# Patient Record
Sex: Female | Born: 1960 | Race: White | Hispanic: No | Smoking: Never smoker
Health system: Southern US, Community
[De-identification: ages and names within clinical notes are randomized; demographics above are authoritative.]

## PROBLEM LIST (undated history)

## (undated) DIAGNOSIS — F32A Depression, unspecified: Secondary | ICD-10-CM

## (undated) DIAGNOSIS — F329 Major depressive disorder, single episode, unspecified: Secondary | ICD-10-CM

## (undated) DIAGNOSIS — F419 Anxiety disorder, unspecified: Secondary | ICD-10-CM

## (undated) HISTORY — DX: Depression, unspecified: F32.A

## (undated) HISTORY — DX: Major depressive disorder, single episode, unspecified: F32.9

## (undated) HISTORY — DX: Anxiety disorder, unspecified: F41.9

---

## 1995-10-15 HISTORY — PX: TUBAL LIGATION: SHX77

## 1998-09-26 ENCOUNTER — Other Ambulatory Visit: Admission: RE | Admit: 1998-09-26 | Discharge: 1998-09-26 | Payer: Self-pay | Admitting: Gynecology

## 1999-07-25 ENCOUNTER — Other Ambulatory Visit: Admission: RE | Admit: 1999-07-25 | Discharge: 1999-07-25 | Payer: Self-pay | Admitting: Gynecology

## 2000-07-28 ENCOUNTER — Other Ambulatory Visit: Admission: RE | Admit: 2000-07-28 | Discharge: 2000-07-28 | Payer: Self-pay | Admitting: Gynecology

## 2002-08-09 ENCOUNTER — Other Ambulatory Visit: Admission: RE | Admit: 2002-08-09 | Discharge: 2002-08-09 | Payer: Self-pay | Admitting: Internal Medicine

## 2003-06-08 ENCOUNTER — Other Ambulatory Visit: Admission: RE | Admit: 2003-06-08 | Discharge: 2003-06-08 | Payer: Self-pay | Admitting: Gynecology

## 2004-06-28 ENCOUNTER — Other Ambulatory Visit: Admission: RE | Admit: 2004-06-28 | Discharge: 2004-06-28 | Payer: Self-pay | Admitting: Gynecology

## 2005-12-30 ENCOUNTER — Other Ambulatory Visit: Admission: RE | Admit: 2005-12-30 | Discharge: 2005-12-30 | Payer: Self-pay | Admitting: Gynecology

## 2008-05-16 ENCOUNTER — Other Ambulatory Visit: Admission: RE | Admit: 2008-05-16 | Discharge: 2008-05-16 | Payer: Self-pay | Admitting: Gynecology

## 2009-05-18 ENCOUNTER — Encounter: Payer: Self-pay | Admitting: Women's Health

## 2009-05-18 ENCOUNTER — Ambulatory Visit: Payer: Self-pay | Admitting: Women's Health

## 2009-05-18 ENCOUNTER — Other Ambulatory Visit: Admission: RE | Admit: 2009-05-18 | Discharge: 2009-05-18 | Payer: Self-pay | Admitting: Gynecology

## 2009-05-22 ENCOUNTER — Ambulatory Visit: Payer: Self-pay | Admitting: Women's Health

## 2010-01-01 ENCOUNTER — Ambulatory Visit: Payer: Self-pay | Admitting: Women's Health

## 2011-06-14 DIAGNOSIS — F32A Depression, unspecified: Secondary | ICD-10-CM | POA: Insufficient documentation

## 2011-06-14 DIAGNOSIS — F419 Anxiety disorder, unspecified: Secondary | ICD-10-CM | POA: Insufficient documentation

## 2011-06-14 DIAGNOSIS — F329 Major depressive disorder, single episode, unspecified: Secondary | ICD-10-CM | POA: Insufficient documentation

## 2011-06-20 ENCOUNTER — Encounter: Payer: Self-pay | Admitting: Women's Health

## 2011-06-20 ENCOUNTER — Other Ambulatory Visit: Payer: Self-pay

## 2011-06-20 ENCOUNTER — Other Ambulatory Visit (HOSPITAL_COMMUNITY)
Admission: RE | Admit: 2011-06-20 | Discharge: 2011-06-20 | Disposition: A | Discharge status: Home / Self Care | Payer: Managed Care, Other (non HMO) | Source: Ambulatory Visit | Attending: Women's Health | Admitting: Women's Health

## 2011-06-20 ENCOUNTER — Ambulatory Visit (INDEPENDENT_AMBULATORY_CARE_PROVIDER_SITE_OTHER): Payer: Managed Care, Other (non HMO) | Admitting: Women's Health

## 2011-06-20 VITALS — BP 120/70 | Ht 64.75 in | Wt 121.0 lb

## 2011-06-20 DIAGNOSIS — Z01419 Encounter for gynecological examination (general) (routine) without abnormal findings: Secondary | ICD-10-CM | POA: Insufficient documentation

## 2011-06-20 DIAGNOSIS — R928 Other abnormal and inconclusive findings on diagnostic imaging of breast: Secondary | ICD-10-CM

## 2011-06-20 DIAGNOSIS — N63 Unspecified lump in unspecified breast: Secondary | ICD-10-CM

## 2011-06-20 NOTE — Progress Notes (Signed)
Loretta Parker [birth year] [id number]    History:    The patient presents for annual exam.  Works as a Systems analyst. Daughter Trula Ore is doing better,has a history of anorexia that required inpatient hospitalization and she is a type I diabetic.   Past medical history, past surgical history, family history and social history were all reviewed and documented in the EPIC chart.   ROS:  A  ROS was performed and pertinent positives and negatives are included in the history.  Exam:  Filed Vitals:   06/20/11 1405  BP: 120/70    General appearance:  Normal Head/Neck:  Normal, without cervical or supraclavicular adenopathy. Thyroid:  Symmetrical, normal in size, without palpable masses or nodularity. Respiratory  Effort:  Normal  Auscultation:  Clear without wheezing or rhonchi Cardiovascular  Auscultation:  Regular rate, without rubs, murmurs or gallops  Edema/varicosities:  Not grossly evident Abdominal  Soft,nontender, without masses, guarding or rebound.  Liver/spleen:  No organomegaly noted  Hernia:  None appreciated  Skin  Inspection:  Grossly normal  Palpation:  Grossly normal Neurologic/psychiatric  Orientation:  Normal with appropriate conversation.  Mood/affect:  Normal  Genitourinary    Breasts: Examined lying and sitting.     Right: 2 cm mobile nodule on outer aspect at 9:00     Left: Without masses, retractions, discharge or axillary adenopathy.   Inguinal/mons:  Normal without inguinal adenopathy  External genitalia:  Normal  BUS/Urethra/Skene's glands:  Normal  Bladder:  Normal  Vagina:  Normal  Cervix:  Normal  Uterus: normal in size, shape and contour.  Midline and mobile  Adnexa/parametria:     Rt: Without masses or tenderness.   Lt: Without masses or tenderness.  Anus and perineum: Normal  Digital rectal exam: Normal sphincter tone without palpated masses or tenderness  Assessment/Plan:  50 y.o.DWF G2P2  for annual exam. Monthly 4-5 day cycle,  history of a BTL. Denies menopausal symptoms. States had normal labs at her primary care.  2 CM mobile nodule on right outer rest at 9:00 position. Diagnostic mammogram with ultrasound scheduled for 06/25/2011. Her last mammogram was in 06, and did again reviewed the importance of annual screening.Reviewed importance of screening colonoscopy, states will schedule. Calcium rich diet encouraged, continue SBEs.. Pap only today.    [name] [facility], 5:35 PM 06/20/2011

## 2011-06-20 NOTE — Progress Notes (Signed)
Loretta Parker AT [facility] SET UP APPT FOR 06/25/11 @ 2P.M. PT. NOTIFIED OF APPT. FAXED RELEASE TO [facility] TO GET PTS. PREVIOUS FILMS FROM THEM

## 2011-06-25 ENCOUNTER — Ambulatory Visit
Admission: RE | Admit: 2011-06-25 | Discharge: 2011-06-25 | Disposition: A | Discharge status: Home / Self Care | Payer: Managed Care, Other (non HMO) | Source: Ambulatory Visit | Attending: Women's Health | Admitting: Women's Health

## 2011-06-25 DIAGNOSIS — N63 Unspecified lump in unspecified breast: Secondary | ICD-10-CM

## 2012-02-04 ENCOUNTER — Encounter: Payer: Self-pay | Admitting: Gynecology

## 2012-02-04 ENCOUNTER — Ambulatory Visit (INDEPENDENT_AMBULATORY_CARE_PROVIDER_SITE_OTHER): Payer: Managed Care, Other (non HMO) | Admitting: Gynecology

## 2012-02-04 VITALS — BP 110/70

## 2012-02-04 DIAGNOSIS — N760 Acute vaginitis: Secondary | ICD-10-CM

## 2012-02-04 DIAGNOSIS — A499 Bacterial infection, unspecified: Secondary | ICD-10-CM

## 2012-02-04 DIAGNOSIS — B9689 Other specified bacterial agents as the cause of diseases classified elsewhere: Secondary | ICD-10-CM

## 2012-02-04 DIAGNOSIS — N898 Other specified noninflammatory disorders of vagina: Secondary | ICD-10-CM

## 2012-02-04 LAB — WET PREP FOR TRICH, YEAST, CLUE: Yeast Wet Prep HPF POC: NONE SEEN

## 2012-02-04 MED ORDER — CLINDAMYCIN PHOSPHATE 2 % VA CREA: 1.0000 | TOPICAL_CREAM | Freq: Every day | VAGINAL | Status: AC

## 2012-02-04 NOTE — Patient Instructions (Signed)
Bacterial Vaginosis Bacterial vaginosis (BV) is a vaginal infection where the normal balance of bacteria in the vagina is disrupted. The normal balance is then replaced by an overgrowth of certain bacteria. There are several different kinds of bacteria that can cause BV. BV is the most common vaginal infection in women of childbearing age. CAUSES   The cause of BV is not fully understood. BV develops when there is an increase or imbalance of harmful bacteria.   Some activities or behaviors can upset the normal balance of bacteria in the vagina and put women at increased risk including:   Having a new sex partner or multiple sex partners.   Douching.   Using an intrauterine device (IUD) for contraception.   It is not clear what role sexual activity plays in the development of BV. However, women that have never had sexual intercourse are rarely infected with BV.  Women do not get BV from toilet seats, bedding, swimming pools or from touching objects around them.  SYMPTOMS   Grey vaginal discharge.   A fish-like odor with discharge, especially after sexual intercourse.   Itching or burning of the vagina and vulva.   Burning or pain with urination.   Some women have no signs or symptoms at all.  DIAGNOSIS  Your caregiver must examine the vagina for signs of BV. Your caregiver will perform lab tests and look at the sample of vaginal fluid through a microscope. They will look for bacteria and abnormal cells (clue cells), a pH test higher than 4.5, and a positive amine test all associated with BV.  RISKS AND COMPLICATIONS   Pelvic inflammatory disease (PID).   Infections following gynecology surgery.   Developing HIV.   Developing herpes virus.  TREATMENT  Sometimes BV will clear up without treatment. However, all women with symptoms of BV should be treated to avoid complications, especially if gynecology surgery is planned. Female partners generally do not need to be treated. However,  BV may spread between female sex partners so treatment is helpful in preventing a recurrence of BV.   BV may be treated with antibiotics. The antibiotics come in either pill or vaginal cream forms. Either can be used with nonpregnant or pregnant women, but the recommended dosages differ. These antibiotics are not harmful to the baby.   BV can recur after treatment. If this happens, a second round of antibiotics will often be prescribed.   Treatment is important for pregnant women. If not treated, BV can cause a premature delivery, especially for a pregnant woman who had a premature birth in the past. All pregnant women who have symptoms of BV should be checked and treated.   For chronic reoccurrence of BV, treatment with a type of prescribed gel vaginally twice a week is helpful.  HOME CARE INSTRUCTIONS   Finish all medication as directed by your caregiver.   Do not have sex until treatment is completed.   Tell your sexual partner that you have a vaginal infection. They should see their caregiver and be treated if they have problems, such as a mild rash or itching.   Practice safe sex. Use condoms. Only have 1 sex partner.  PREVENTION  Basic prevention steps can help reduce the risk of upsetting the natural balance of bacteria in the vagina and developing BV:  Do not have sexual intercourse (be abstinent).   Do not douche.   Use all of the medicine prescribed for treatment of BV, even if the signs and symptoms go away.     Tell your sex partner if you have BV. That way, they can be treated, if needed, to prevent reoccurrence.  SEEK MEDICAL CARE IF:   Your symptoms are not improving after 3 days of treatment.   You have increased discharge, pain, or fever.  MAKE SURE YOU:   Understand these instructions.   Will watch your condition.   Will get help right away if you are not doing well or get worse.  FOR MORE INFORMATION  Division of STD Prevention (DSTDP), Centers for Disease  Control and Prevention: www.cdc.gov/std American Social Health Association (ASHA): www.ashastd.org  Document Released: 09/30/2005 Document Revised: 09/19/2011 Document Reviewed: 03/23/2009 [employer] Patient Information 2012 [employer]. 

## 2012-02-04 NOTE — Progress Notes (Signed)
Patient 51 year old who presented to the office today stating her last menstrual period was April 9 which lasted 5 days which is normal for her. After missing she continued having brownish discharge with a fishy odor. Patient with prior history of tubal ligation several years ago. Patient is in a monogamous relationship.  Exam: Bartholin urethra Skene was: Within normal limits Vagina: Foreign body noted (tampon) no other lesions noted Cervix: No lesions or discharge Uterus: Anteverted normal size shape and consistency Adnexa: No palpable masses or tenderness Rectal: Not examined  After the tampon was removed and discarded a wet prep was done which demonstrated evidence of bacterial vaginosis (many clue cells and too numerous to count bacteria and positive amine).  Assessment/plan: Bacterial vaginosis as a result of retained tampon which was removed. Patient will be placed on Cleocin vaginal cream to apply each bedtime for 5 days.

## 2012-06-25 ENCOUNTER — Encounter: Payer: Managed Care, Other (non HMO) | Admitting: Women's Health

## 2012-07-02 ENCOUNTER — Encounter: Payer: Self-pay | Admitting: Women's Health

## 2012-07-02 ENCOUNTER — Ambulatory Visit (INDEPENDENT_AMBULATORY_CARE_PROVIDER_SITE_OTHER): Payer: Managed Care, Other (non HMO) | Admitting: Women's Health

## 2012-07-02 VITALS — BP 112/82 | Ht 65.0 in | Wt 118.0 lb

## 2012-07-02 DIAGNOSIS — G4700 Insomnia, unspecified: Secondary | ICD-10-CM

## 2012-07-02 DIAGNOSIS — B3731 Acute candidiasis of vulva and vagina: Secondary | ICD-10-CM

## 2012-07-02 DIAGNOSIS — B373 Candidiasis of vulva and vagina: Secondary | ICD-10-CM

## 2012-07-02 DIAGNOSIS — Z01419 Encounter for gynecological examination (general) (routine) without abnormal findings: Secondary | ICD-10-CM

## 2012-07-02 LAB — CBC WITH DIFFERENTIAL/PLATELET
HCT: 38.5 % (ref 36.0–46.0)
Hemoglobin: 13.7 g/dL (ref 12.0–15.0)
Lymphs Abs: 2 10*3/uL (ref 0.7–4.0)
MCH: 30.9 pg (ref 26.0–34.0)
Monocytes Relative: 7 % (ref 3–12)
Neutro Abs: 4.3 10*3/uL (ref 1.7–7.7)
Neutrophils Relative %: 61 % (ref 43–77)
RBC: 4.44 MIL/uL (ref 3.87–5.11)

## 2012-07-02 LAB — WET PREP FOR TRICH, YEAST, CLUE: Clue Cells Wet Prep HPF POC: NONE SEEN

## 2012-07-02 MED ORDER — ZOLPIDEM TARTRATE 10 MG PO TABS: 10.0000 mg | ORAL_TABLET | Freq: Every evening | ORAL | Status: DC | PRN

## 2012-07-02 MED ORDER — FLUCONAZOLE 150 MG PO TABS: 150.0000 mg | ORAL_TABLET | Freq: Once | ORAL | Status: DC

## 2012-07-02 NOTE — Progress Notes (Signed)
Loretta Parker 06-Apr-1961 [id number]    History:    The patient presents for annual exam.  Regular monthly cycle history of BTL. History of normal Paps and mammograms. Has not had a colonoscopy.   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 Christina 20, anorexia and type 1 diabetes. Rolm Gala doing well graduated from [employer] moved to [place]. Engaged greater than 10 years, partner diagnosed with tonsil esophageal cancer associated with HPV had radiation 2011-2012.   ROS:  A  ROS was performed and pertinent positives and negatives are included in the history.  Exam:  Filed Vitals:   07/02/12 1406  BP: 112/82    General appearance:  Normal Head/Neck:  Normal, without cervical or supraclavicular adenopathy. Thyroid:  Symmetrical, normal in size, without palpable masses or nodularity. Respiratory  Effort:  Normal  Auscultation:  Clear without wheezing or rhonchi Cardiovascular  Auscultation:  Regular rate, without rubs, murmurs or gallops  Edema/varicosities:  Not grossly evident Abdominal  Soft,nontender, without masses, guarding or rebound.  Liver/spleen:  No organomegaly noted  Hernia:  None appreciated  Skin  Inspection:  Grossly normal  Palpation:  Grossly normal Neurologic/psychiatric  Orientation:  Normal with appropriate conversation.  Mood/affect:  Normal  Genitourinary    Breasts: Examined lying and sitting.     Right: Without masses, retractions, discharge or axillary adenopathy.     Left: Without masses, retractions, discharge or axillary adenopathy.   Inguinal/mons:  Normal without inguinal adenopathy  External genitalia:  Normal  BUS/Urethra/Skene's glands:  Normal  Bladder:  Normal  Vagina:  Yeast   Cervix:  Normal  Uterus:   normal in size, shape and contour.  Midline and mobile  Adnexa/parametria:     Rt: Without masses or tenderness.   Lt: Without masses or  tenderness.  Anus and perineum: Normal  Digital rectal exam: Normal sphincter tone without palpated masses or tenderness  Assessment/Plan:  51 y.o. D. WF G2 P2 for annual exam with complaint of vaginal itching for 2 days.  Yeast  Plan: Diflucan 150mg  with refill. Aware of yeast prevention. CBC, UA, Pap with HR HPV typing. New Pap screening guidelines reviewed. SBE's, annual mammogram, calcium rich diet, vitamin D 1000 daily. Instructed to schedule screening colonoscopy. Home Hemoccult card given with instructions.    [name] [facility], 5:31 PM 07/02/2012

## 2012-07-02 NOTE — Patient Instructions (Signed)
Health Maintenance, Females A healthy lifestyle and preventative care can promote health and wellness.  Maintain regular health, dental, and eye exams.   Eat a healthy diet. Foods like vegetables, fruits, whole grains, low-fat dairy products, and lean protein foods contain the nutrients you need without too many calories. Decrease your intake of foods high in solid fats, added sugars, and salt. Get information about a proper diet from your caregiver, if necessary.   Regular physical exercise is one of the most important things you can do for your health. Most adults should get at least 150 minutes of moderate-intensity exercise (any activity that increases your heart rate and causes you to sweat) each week. In addition, most adults need muscle-strengthening exercises on 2 or more days a week.    Maintain a healthy weight. The body mass index (BMI) is a screening tool to identify possible weight problems. It provides an estimate of body fat based on height and weight. Your caregiver can help determine your BMI, and can help you achieve or maintain a healthy weight. For adults 20 years and older:   A BMI below 18.5 is considered underweight.   A BMI of 18.5 to 24.9 is normal.   A BMI of 25 to 29.9 is considered overweight.   A BMI of 30 and above is considered obese.   Maintain normal blood lipids and cholesterol by exercising and minimizing your intake of saturated fat. Eat a balanced diet with plenty of fruits and vegetables. Blood tests for lipids and cholesterol should begin at age 20 and be repeated every 5 years. If your lipid or cholesterol levels are high, you are over 50, or you are a high risk for heart disease, you may need your cholesterol levels checked more frequently.Ongoing high lipid and cholesterol levels should be treated with medicines if diet and exercise are not effective.   If you smoke, find out from your caregiver how to quit. If you do not use tobacco, do not start.    If you are pregnant, do not drink alcohol. If you are breastfeeding, be very cautious about drinking alcohol. If you are not pregnant and choose to drink alcohol, do not exceed 1 drink per day. One drink is considered to be 12 ounces (355 mL) of beer, 5 ounces (148 mL) of wine, or 1.5 ounces (44 mL) of liquor.   Avoid use of street drugs. Do not share needles with anyone. Ask for help if you need support or instructions about stopping the use of drugs.   High blood pressure causes heart disease and increases the risk of stroke. Blood pressure should be checked at least every 1 to 2 years. Ongoing high blood pressure should be treated with medicines, if weight loss and exercise are not effective.   If you are 55 to 51 years old, ask your caregiver if you should take aspirin to prevent strokes.   Diabetes screening involves taking a blood sample to check your fasting blood sugar level. This should be done once every 3 years, after age 45, if you are within normal weight and without risk factors for diabetes. Testing should be considered at a younger age or be carried out more frequently if you are overweight and have at least 1 risk factor for diabetes.   Breast cancer screening is essential preventative care for women. You should practice "breast self-awareness." This means understanding the normal appearance and feel of your breasts and may include breast self-examination. Any changes detected, no matter how   small, should be reported to a caregiver. Women in their 20s and 30s should have a clinical breast exam (CBE) by a caregiver as part of a regular health exam every 1 to 3 years. After age 40, women should have a CBE every year. Starting at age 40, women should consider having a mammogram (breast X-ray) every year. Women who have a family history of breast cancer should talk to their caregiver about genetic screening. Women at a high risk of breast cancer should talk to their caregiver about having  an MRI and a mammogram every year.   The Pap test is a screening test for cervical cancer. Women should have a Pap test starting at age 21. Between ages 21 and 29, Pap tests should be repeated every 2 years. Beginning at age 30, you should have a Pap test every 3 years as long as the past 3 Pap tests have been normal. If you had a hysterectomy for a problem that was not cancer or a condition that could lead to cancer, then you no longer need Pap tests. If you are between ages 65 and 70, and you have had normal Pap tests going back 10 years, you no longer need Pap tests. If you have had past treatment for cervical cancer or a condition that could lead to cancer, you need Pap tests and screening for cancer for at least 20 years after your treatment. If Pap tests have been discontinued, risk factors (such as a new sexual partner) need to be reassessed to determine if screening should be resumed. Some women have medical problems that increase the chance of getting cervical cancer. In these cases, your caregiver may recommend more frequent screening and Pap tests.   The human papillomavirus (HPV) test is an additional test that may be used for cervical cancer screening. The HPV test looks for the virus that can cause the cell changes on the cervix. The cells collected during the Pap test can be tested for HPV. The HPV test could be used to screen women aged 30 years and older, and should be used in women of any age who have unclear Pap test results. After the age of 30, women should have HPV testing at the same frequency as a Pap test.   Colorectal cancer can be detected and often prevented. Most routine colorectal cancer screening begins at the age of 50 and continues through age 75. However, your caregiver may recommend screening at an earlier age if you have risk factors for colon cancer. On a yearly basis, your caregiver may provide home test kits to check for hidden blood in the stool. Use of a small camera at  the end of a tube, to directly examine the colon (sigmoidoscopy or colonoscopy), can detect the earliest forms of colorectal cancer. Talk to your caregiver about this at age 50, when routine screening begins. Direct examination of the colon should be repeated every 5 to 10 years through age 75, unless early forms of pre-cancerous polyps or small growths are found.   Hepatitis C blood testing is recommended for all people born from 1945 through 1965 and any individual with known risks for hepatitis C.   Practice safe sex. Use condoms and avoid high-risk sexual practices to reduce the spread of sexually transmitted infections (STIs). Sexually active women aged 25 and younger should be checked for Chlamydia, which is a common sexually transmitted infection. Older women with new or multiple partners should also be tested for Chlamydia. Testing for other   STIs is recommended if you are sexually active and at increased risk.   Osteoporosis is a disease in which the bones lose minerals and strength with aging. This can result in serious bone fractures. The risk of osteoporosis can be identified using a bone density scan. Women ages 49 and over and women at risk for fractures or osteoporosis should discuss screening with their caregivers. Ask your caregiver whether you should be taking a calcium supplement or vitamin D to reduce the rate of osteoporosis.   Menopause can be associated with physical symptoms and risks. Hormone replacement therapy is available to decrease symptoms and risks. You should talk to your caregiver about whether hormone replacement therapy is right for you.   Use sunscreen with a sun protection factor (SPF) of 30 or greater. Apply sunscreen liberally and repeatedly throughout the day. You should seek shade when your shadow is shorter than you. Protect yourself by wearing long sleeves, pants, a wide-brimmed hat, and sunglasses year round, whenever you are outdoors.   Notify your caregiver  of new moles or changes in moles, especially if there is a change in shape or color. Also notify your caregiver if a mole is larger than the size of a pencil eraser.   Stay current with your immunizations.  Document Released: 04/15/2011 Document Revised: 09/19/2011 Document Reviewed: 04/15/2011 [facility] Patient Information 2012 [place], [place].Insomnia Insomnia is frequent trouble falling and/or staying asleep. Insomnia can be a long term problem or a short term problem. Both are common. Insomnia can be a short term problem when the wakefulness is related to a certain stress or worry. Long term insomnia is often related to ongoing stress during waking hours and/or poor sleeping habits. Overtime, sleep deprivation itself can make the problem worse. Every little thing feels more severe because you are overtired and your ability to cope is decreased. CAUSES   Stress, anxiety, and depression.   Poor sleeping habits.   Distractions such as TV in the bedroom.   Naps close to bedtime.   Engaging in emotionally charged conversations before bed.   Technical reading before sleep.   Alcohol and other sedatives. They may make the problem worse. They can hurt normal sleep patterns and normal dream activity.   Stimulants such as caffeine for several hours prior to bedtime.   Pain syndromes and shortness of breath can cause insomnia.   Exercise late at night.   Changing time zones may cause sleeping problems (jet lag).  It is sometimes helpful to have someone observe your sleeping patterns. They should look for periods of not breathing during the night (sleep apnea). They should also look to see how long those periods last. If you live alone or observers are uncertain, you can also be observed at a sleep clinic where your sleep patterns will be professionally monitored. Sleep apnea requires a checkup and treatment. Give your caregivers your medical history. Give your caregivers observations your family  has made about your sleep.  SYMPTOMS   Not feeling rested in the morning.   Anxiety and restlessness at bedtime.   Difficulty falling and staying asleep.  TREATMENT   Your caregiver may prescribe treatment for an underlying medical disorders. Your caregiver can give advice or help if you are using alcohol or other drugs for self-medication. Treatment of underlying problems will usually eliminate insomnia problems.   Medications can be prescribed for short time use. They are generally not recommended for lengthy use.   Over-the-counter sleep medicines are not recommended for lengthy  use. They can be habit forming.   You can promote easier sleeping by making lifestyle changes such as:   Using relaxation techniques that help with breathing and reduce muscle tension.   Exercising earlier in the day.   Changing your diet and the time of your last meal. No night time snacks.   Establish a regular time to go to bed.   Counseling can help with stressful problems and worry.   Soothing music and white noise may be helpful if there are background noises you cannot remove.   Stop tedious detailed work at least one hour before bedtime.  HOME CARE INSTRUCTIONS   Keep a diary. Inform your caregiver about your progress. This includes any medication side effects. See your caregiver regularly. Take note of:   Times when you are asleep.   Times when you are awake during the night.   The quality of your sleep.   How you feel the next day.  This information will help your caregiver care for you.  Get out of bed if you are still awake after 15 minutes. Read or do some quiet activity. Keep the lights down. Wait until you feel sleepy and go back to bed.   Keep regular sleeping and waking hours. Avoid naps.   Exercise regularly.   Avoid distractions at bedtime. Distractions include watching television or engaging in any intense or detailed activity like attempting to balance the household  checkbook.   Develop a bedtime ritual. Keep a familiar routine of bathing, brushing your teeth, climbing into bed at the same time each night, listening to soothing music. Routines increase the success of falling to sleep faster.   Use relaxation techniques. This can be using breathing and muscle tension release routines. It can also include visualizing peaceful scenes. You can also help control troubling or intruding thoughts by keeping your mind occupied with boring or repetitive thoughts like the old concept of counting sheep. You can make it more creative like imagining planting one beautiful flower after another in your backyard garden.   During your day, work to eliminate stress. When this is not possible use some of the previous suggestions to help reduce the anxiety that accompanies stressful situations.  MAKE SURE YOU:   Understand these instructions.   Will watch your condition.   Will get help right away if you are not doing well or get worse.  Document Released: 09/27/2000 Document Revised: 09/19/2011 Document Reviewed: 10/28/2007 [facility] Patient Information 2012 [place], [place].

## 2012-07-03 LAB — URINALYSIS W MICROSCOPIC + REFLEX CULTURE
Casts: NONE SEEN
Crystals: NONE SEEN
Glucose, UA: NEGATIVE mg/dL
Squamous Epithelial / LPF: NONE SEEN
pH: 7 (ref 5.0–8.0)

## 2012-07-20 NOTE — Addendum Note (Signed)
Addended by: Bertram Savin A on: 07/20/2012 10:03 AM   Modules accepted: Orders

## 2012-12-04 ENCOUNTER — Ambulatory Visit: Payer: Managed Care, Other (non HMO) | Admitting: Women's Health

## 2013-07-08 ENCOUNTER — Ambulatory Visit (INDEPENDENT_AMBULATORY_CARE_PROVIDER_SITE_OTHER): Payer: Managed Care, Other (non HMO) | Admitting: Women's Health

## 2013-07-08 ENCOUNTER — Other Ambulatory Visit (HOSPITAL_COMMUNITY)
Admission: RE | Admit: 2013-07-08 | Discharge: 2013-07-08 | Disposition: A | Discharge status: Home / Self Care | Payer: Managed Care, Other (non HMO) | Source: Ambulatory Visit | Attending: Gynecology | Admitting: Gynecology

## 2013-07-08 ENCOUNTER — Encounter: Payer: Self-pay | Admitting: Women's Health

## 2013-07-08 VITALS — BP 115/70 | Ht 64.5 in | Wt 121.0 lb

## 2013-07-08 DIAGNOSIS — E079 Disorder of thyroid, unspecified: Secondary | ICD-10-CM

## 2013-07-08 DIAGNOSIS — Z01419 Encounter for gynecological examination (general) (routine) without abnormal findings: Secondary | ICD-10-CM

## 2013-07-08 DIAGNOSIS — Z23 Encounter for immunization: Secondary | ICD-10-CM

## 2013-07-08 DIAGNOSIS — G4700 Insomnia, unspecified: Secondary | ICD-10-CM | POA: Insufficient documentation

## 2013-07-08 MED ORDER — ESZOPICLONE 1 MG PO TABS: 1.0000 mg | ORAL_TABLET | Freq: Every day | ORAL | Status: DC

## 2013-07-08 NOTE — Patient Instructions (Addendum)
Colonoscopy Schedule mammogram  Health Maintenance, Females A healthy lifestyle and preventative care can promote health and wellness.  Maintain regular health, dental, and eye exams.  Eat a healthy diet. Foods like vegetables, fruits, whole grains, low-fat dairy products, and lean protein foods contain the nutrients you need without too many calories. Decrease your intake of foods high in solid fats, added sugars, and salt. Get information about a proper diet from your caregiver, if necessary.  Regular physical exercise is one of the most important things you can do for your health. Most adults should get at least 150 minutes of moderate-intensity exercise (any activity that increases your heart rate and causes you to sweat) each week. In addition, most adults need muscle-strengthening exercises on 2 or more days a week.   Maintain a healthy weight. The body mass index (BMI) is a screening tool to identify possible weight problems. It provides an estimate of body fat based on height and weight. Your caregiver can help determine your BMI, and can help you achieve or maintain a healthy weight. For adults 20 years and older:  A BMI below 18.5 is considered underweight.  A BMI of 18.5 to 24.9 is normal.  A BMI of 25 to 29.9 is considered overweight.  A BMI of 30 and above is considered obese.  Maintain normal blood lipids and cholesterol by exercising and minimizing your intake of saturated fat. Eat a balanced diet with plenty of fruits and vegetables. Blood tests for lipids and cholesterol should begin at age 47 and be repeated every 5 years. If your lipid or cholesterol levels are high, you are over 50, or you are a high risk for heart disease, you may need your cholesterol levels checked more frequently.Ongoing high lipid and cholesterol levels should be treated with medicines if diet and exercise are not effective.  If you smoke, find out from your caregiver how to quit. If you do not use  tobacco, do not start.  If you are pregnant, do not drink alcohol. If you are breastfeeding, be very cautious about drinking alcohol. If you are not pregnant and choose to drink alcohol, do not exceed 1 drink per day. One drink is considered to be 12 ounces (355 mL) of beer, 5 ounces (148 mL) of wine, or 1.5 ounces (44 mL) of liquor.  Avoid use of street drugs. Do not share needles with anyone. Ask for help if you need support or instructions about stopping the use of drugs.  High blood pressure causes heart disease and increases the risk of stroke. Blood pressure should be checked at least every 1 to 2 years. Ongoing high blood pressure should be treated with medicines, if weight loss and exercise are not effective.  If you are 67 to 52 years old, ask your caregiver if you should take aspirin to prevent strokes.  Diabetes screening involves taking a blood sample to check your fasting blood sugar level. This should be done once every 3 years, after age 35, if you are within normal weight and without risk factors for diabetes. Testing should be considered at a younger age or be carried out more frequently if you are overweight and have at least 1 risk factor for diabetes.  Breast cancer screening is essential preventative care for women. You should practice "breast self-awareness." This means understanding the normal appearance and feel of your breasts and may include breast self-examination. Any changes detected, no matter how small, should be reported to a caregiver. Women in their 64s  and 30s should have a clinical breast exam (CBE) by a caregiver as part of a regular health exam every 1 to 3 years. After age 50, women should have a CBE every year. Starting at age 18, women should consider having a mammogram (breast X-ray) every year. Women who have a family history of breast cancer should talk to their caregiver about genetic screening. Women at a high risk of breast cancer should talk to their  caregiver about having an MRI and a mammogram every year.  The Pap test is a screening test for cervical cancer. Women should have a Pap test starting at age 46. Between ages 19 and 27, Pap tests should be repeated every 2 years. Beginning at age 67, you should have a Pap test every 3 years as long as the past 3 Pap tests have been normal. If you had a hysterectomy for a problem that was not cancer or a condition that could lead to cancer, then you no longer need Pap tests. If you are between ages 82 and 34, and you have had normal Pap tests going back 10 years, you no longer need Pap tests. If you have had past treatment for cervical cancer or a condition that could lead to cancer, you need Pap tests and screening for cancer for at least 20 years after your treatment. If Pap tests have been discontinued, risk factors (such as a new sexual partner) need to be reassessed to determine if screening should be resumed. Some women have medical problems that increase the chance of getting cervical cancer. In these cases, your caregiver may recommend more frequent screening and Pap tests.  The human papillomavirus (HPV) test is an additional test that may be used for cervical cancer screening. The HPV test looks for the virus that can cause the cell changes on the cervix. The cells collected during the Pap test can be tested for HPV. The HPV test could be used to screen women aged 59 years and older, and should be used in women of any age who have unclear Pap test results. After the age of 21, women should have HPV testing at the same frequency as a Pap test.  Colorectal cancer can be detected and often prevented. Most routine colorectal cancer screening begins at the age of 108 and continues through age 38. However, your caregiver may recommend screening at an earlier age if you have risk factors for colon cancer. On a yearly basis, your caregiver may provide home test kits to check for hidden blood in the stool. Use  of a small camera at the end of a tube, to directly examine the colon (sigmoidoscopy or colonoscopy), can detect the earliest forms of colorectal cancer. Talk to your caregiver about this at age 43, when routine screening begins. Direct examination of the colon should be repeated every 5 to 10 years through age 65, unless early forms of pre-cancerous polyps or small growths are found.  Hepatitis C blood testing is recommended for all people born from [birth year] through 1965 and any individual with known risks for hepatitis C.  Practice safe sex. Use condoms and avoid high-risk sexual practices to reduce the spread of sexually transmitted infections (STIs). Sexually active women aged 35 and younger should be checked for Chlamydia, which is a common sexually transmitted infection. Older women with new or multiple partners should also be tested for Chlamydia. Testing for other STIs is recommended if you are sexually active and at increased risk.  Osteoporosis is a  disease in which the bones lose minerals and strength with aging. This can result in serious bone fractures. The risk of osteoporosis can be identified using a bone density scan. Women ages 62 and over and women at risk for fractures or osteoporosis should discuss screening with their caregivers. Ask your caregiver whether you should be taking a calcium supplement or vitamin D to reduce the rate of osteoporosis.  Menopause can be associated with physical symptoms and risks. Hormone replacement therapy is available to decrease symptoms and risks. You should talk to your caregiver about whether hormone replacement therapy is right for you.  Use sunscreen with a sun protection factor (SPF) of 30 or greater. Apply sunscreen liberally and repeatedly throughout the day. You should seek shade when your shadow is shorter than you. Protect yourself by wearing long sleeves, pants, a wide-brimmed hat, and sunglasses year round, whenever you are outdoors.  Notify  your caregiver of new moles or changes in moles, especially if there is a change in shape or color. Also notify your caregiver if a mole is larger than the size of a pencil eraser.  Stay current with your immunizations. Document Released: 04/15/2011 Document Revised: 12/23/2011 Document Reviewed: 04/15/2011 [employer] Patient Information 2014 [place], [place].

## 2013-07-08 NOTE — Addendum Note (Signed)
Addended by: Richardson Chiquito on: 07/08/2013 03:53 PM   Modules accepted: Orders

## 2013-07-08 NOTE — Addendum Note (Signed)
Addended by: Richardson Chiquito on: 07/08/2013 03:54 PM   Modules accepted: Orders

## 2013-07-08 NOTE — Progress Notes (Signed)
Loretta Parker 1960/12/20 [id number]    History:    The patient presents for annual exam.  Regular monthly cycles/BTL. Only complaint is unable to sleep well, states able to fall asleep but does not stay asleep causing fatigue. History of normal Paps and mammograms. Has not had a colonoscopy. Has had some problems with anxiety and depression on no medication denies need.    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 Partner of 10 years history of tonsil cancer and radiation treatment 2011 2012.   ROS:  A  ROS was performed and pertinent positives and negatives are included in the history.  Exam:  Filed Vitals:   07/08/13 1410  BP: 115/70    General appearance:  Normal Head/Neck:  Normal, without cervical or supraclavicular adenopathy. Thyroid:  Symmetrical, normal in size, without palpable masses or nodularity. Respiratory  Effort:  Normal  Auscultation:  Clear without wheezing or rhonchi Cardiovascular  Auscultation:  Regular rate, without rubs, murmurs or gallops  Edema/varicosities:  Not grossly evident Abdominal  Soft,nontender, without masses, guarding or rebound.  Liver/spleen:  No organomegaly noted  Hernia:  None appreciated  Skin  Inspection:  Grossly normal  Palpation:  Grossly normal Neurologic/psychiatric  Orientation:  Normal with appropriate conversation.  Mood/affect:  Normal  Genitourinary    Breasts: Examined lying and sitting.     Right: Without masses, retractions, discharge or axillary adenopathy.     Left: Without masses, retractions, discharge or axillary adenopathy.   Inguinal/mons:  Normal without inguinal adenopathy  External genitalia:  Normal  BUS/Urethra/Skene's glands:  Normal  Bladder:  Normal  Vagina:  Normal  Cervix:  Normal  Uterus:   normal in size, shape and contour.  Midline and mobile  Adnexa/parametria:     Rt: Without masses or tenderness.   Lt: Without masses or tenderness.  Anus and perineum: Normal  Digital rectal exam: Normal sphincter tone without palpated masses or tenderness  Assessment/Plan:  52 y.o. D. WF G2 P2 for annual exam.    Normal GYN exam/BTL Insomnia  Plan: SBE's, schedule annual mammogram, reviewed importance of annual screen, normal 2012. Continue regular exercise, healthy lifestyle, vitamin D 2000 daily encouraged. Options for insomnia reviewed will try Lunesta 1 mg prescription, proper use given and reviewed. Aware of good sleep hygiene. Reviewed and encouraged to schedule screening colonoscopy. CBC, TSH, UA, Pap. Pap normal 2012, new screening guidelines reviewed.   [name] [facility], 3:13 PM 07/08/2013

## 2013-07-09 LAB — URINALYSIS W MICROSCOPIC + REFLEX CULTURE
Bacteria, UA: NONE SEEN
Bilirubin Urine: NEGATIVE
Casts: NONE SEEN
Crystals: NONE SEEN
Ketones, ur: NEGATIVE mg/dL
Nitrite: NEGATIVE
Specific Gravity, Urine: 1.021 (ref 1.005–1.030)
pH: 7.5 (ref 5.0–8.0)

## 2014-07-21 ENCOUNTER — Encounter: Payer: Self-pay | Admitting: Women's Health

## 2014-07-21 ENCOUNTER — Ambulatory Visit (INDEPENDENT_AMBULATORY_CARE_PROVIDER_SITE_OTHER): Payer: 59 | Admitting: Women's Health

## 2014-07-21 VITALS — BP 124/80 | Ht 65.0 in | Wt 117.0 lb

## 2014-07-21 DIAGNOSIS — Z01419 Encounter for gynecological examination (general) (routine) without abnormal findings: Secondary | ICD-10-CM

## 2014-07-21 DIAGNOSIS — E079 Disorder of thyroid, unspecified: Secondary | ICD-10-CM

## 2014-07-21 LAB — CBC WITH DIFFERENTIAL/PLATELET
Basophils Absolute: 0.1 10*3/uL (ref 0.0–0.1)
Basophils Relative: 1 % (ref 0–1)
Eosinophils Absolute: 0.2 10*3/uL (ref 0.0–0.7)
Eosinophils Relative: 3 % (ref 0–5)
HCT: 40.9 % (ref 36.0–46.0)
HEMOGLOBIN: 13.7 g/dL (ref 12.0–15.0)
Lymphocytes Relative: 30 % (ref 12–46)
Lymphs Abs: 1.7 10*3/uL (ref 0.7–4.0)
MCH: 29 pg (ref 26.0–34.0)
MCHC: 33.5 g/dL (ref 30.0–36.0)
MCV: 86.7 fL (ref 78.0–100.0)
MONOS PCT: 6 % (ref 3–12)
Monocytes Absolute: 0.3 10*3/uL (ref 0.1–1.0)
NEUTROS ABS: 3.5 10*3/uL (ref 1.7–7.7)
NEUTROS PCT: 60 % (ref 43–77)
Platelets: 301 10*3/uL (ref 150–400)
RBC: 4.72 MIL/uL (ref 3.87–5.11)
RDW: 14.1 % (ref 11.5–15.5)
WBC: 5.8 10*3/uL (ref 4.0–10.5)

## 2014-07-21 NOTE — Progress Notes (Signed)
Loretta Parker May 06, 1961 [id number]    History:    Presents for annual exam.  Last cycle greater than 6 months ago. Cycles had become more irregular over past year, monthly prior. Occasional menopausal symptoms. Normal Pap and mammogram history, last mammogram 2012. Has not had a colonoscopy. Same partner.  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 Loretta Parker  at [employer] in a PhD program. Partner of many years tonsil cancer, struggling from side effects of radiation.  Ex-husband/father of her children died several months ago from alcohol related problem.  ROS:  A  12 point ROS was performed and pertinent positives and negatives are included.  Exam:  Filed Vitals:   07/21/14 1438  BP: 124/80    General appearance:  Normal Thyroid:  Symmetrical, normal in size, without palpable masses or nodularity. Respiratory  Auscultation:  Clear without wheezing or rhonchi Cardiovascular  Auscultation:  Regular rate, without rubs, murmurs or gallops  Edema/varicosities:  Not grossly evident Abdominal  Soft,nontender, without masses, guarding or rebound.  Liver/spleen:  No organomegaly noted  Hernia:  None appreciated  Skin  Inspection:  Grossly normal   Breasts: Examined lying and sitting.     Right: Without masses, retractions, discharge or axillary adenopathy.     Left: Without masses, retractions, discharge or axillary adenopathy. Gentitourinary   Inguinal/mons:  Normal without inguinal adenopathy  External genitalia:  Normal  BUS/Urethra/Skene's glands:  Normal  Vagina:  Normal  Cervix:  Normal  Uterus:   normal in size, shape and contour.  Midline and mobile  Adnexa/parametria:     Rt: Without masses or tenderness.   Lt: Without masses or tenderness.  Anus and perineum: Normal  Digital rectal exam: Normal sphincter tone  without palpated masses or tenderness  Assessment/Plan:  53 y.o. DWF G2P2 for annual exam.     Perimenopausal Anxiety/depression stable on no medication  Plan: Denies need for counseling or medication at this time. Reviewed importance of screening colonoscopy does have plans to schedule. SBE's,  instructed to schedule annual screening mammogram, reviewed importance, overdue. Continue extremely healthy lifestyle of diet and exercise. Vitamin D 2000 daily. Menopause reviewed definition, symptoms. Instructed to call if any further bleeding. CBC, vitamin D, TSH, UA, Pap normal 2014, new screening guidelines reviewed. (excellent Lipid panel 2014)    Harrington ChallengerYOUNG,Loretta Parker J [facility], 4:43 PM 07/21/2014

## 2014-07-21 NOTE — Patient Instructions (Signed)
Health Recommendations for Postmenopausal Women Respected and ongoing research has looked at the most common causes of death, disability, and poor quality of life in postmenopausal women. The causes include heart disease, diseases of blood vessels, diabetes, depression, cancer, and bone loss (osteoporosis). Many things can be done to help lower the chances of developing these and other common problems. CARDIOVASCULAR DISEASE Heart Disease: A heart attack is a medical emergency. Know the signs and symptoms of a heart attack. Below are things women can do to reduce their risk for heart disease.   Do not smoke. If you smoke, quit.  Aim for a healthy weight. Being overweight causes many preventable deaths. Eat a healthy and balanced diet and drink an adequate amount of liquids.  Get moving. Make a commitment to be more physically active. Aim for 30 minutes of activity on most, if not all days of the week.  Eat for heart health. Choose a diet that is low in saturated fat and cholesterol and eliminate trans fat. Include whole grains, vegetables, and fruits. Read and understand the labels on food containers before buying.  Know your numbers. Ask your caregiver to check your blood pressure, cholesterol (total, HDL, LDL, triglycerides) and blood glucose. Work with your caregiver on improving your entire clinical picture.  High blood pressure. Limit or stop your table salt intake (try salt substitute and food seasonings). Avoid salty foods and drinks. Read labels on food containers before buying. Eating well and exercising can help control high blood pressure. STROKE  Stroke is a medical emergency. Stroke may be the result of a blood clot in a blood vessel in the brain or by a brain hemorrhage (bleeding). Know the signs and symptoms of a stroke. To lower the risk of developing a stroke:  Avoid fatty foods.  Quit smoking.  Control your diabetes, blood pressure, and irregular heart rate. THROMBOPHLEBITIS  (BLOOD CLOT) OF THE LEG  Becoming overweight and leading a stationary lifestyle may also contribute to developing blood clots. Controlling your diet and exercising will help lower the risk of developing blood clots. CANCER SCREENING  Breast Cancer: Take steps to reduce your risk of breast cancer.  You should practice "breast self-awareness." This means understanding the normal appearance and feel of your breasts and should include breast self-examination. Any changes detected, no matter how small, should be reported to your caregiver.  After age 40, you should have a clinical breast exam (CBE) every year.  Starting at age 40, you should consider having a mammogram (breast X-ray) every year.  If you have a family history of breast cancer, talk to your caregiver about genetic screening.  If you are at high risk for breast cancer, talk to your caregiver about having an MRI and a mammogram every year.  Intestinal or Stomach Cancer: Tests to consider are a rectal exam, fecal occult blood, sigmoidoscopy, and colonoscopy. Women who are high risk may need to be screened at an earlier age and more often.  Cervical Cancer:  Beginning at age 30, you should have a Pap test every 3 years as long as the past 3 Pap tests have been normal.  If you have had past treatment for cervical cancer or a condition that could lead to cancer, you need Pap tests and screening for cancer for at least 20 years after your treatment.  If you had a hysterectomy for a problem that was not cancer or a condition that could lead to cancer, then you no longer need Pap tests.    If you are between ages 65 and 70, and you have had normal Pap tests going back 10 years, you no longer need Pap tests.  If Pap tests have been discontinued, risk factors (such as a new sexual partner) need to be reassessed to determine if screening should be resumed.  Some medical problems can increase the chance of getting cervical cancer. In these  cases, your caregiver may recommend more frequent screening and Pap tests.  Uterine Cancer: If you have vaginal bleeding after reaching menopause, you should notify your caregiver.  Ovarian Cancer: Other than yearly pelvic exams, there are no reliable tests available to screen for ovarian cancer at this time except for yearly pelvic exams.  Lung Cancer: Yearly chest X-rays can detect lung cancer and should be done on high risk women, such as cigarette smokers and women with chronic lung disease (emphysema).  Skin Cancer: A complete body skin exam should be done at your yearly examination. Avoid overexposure to the sun and ultraviolet light lamps. Use a strong sun block cream when in the sun. All of these things are important for lowering the risk of skin cancer. MENOPAUSE Menopause Symptoms: Hormone therapy products are effective for treating symptoms associated with menopause:  Moderate to severe hot flashes.  Night sweats.  Mood swings.  Headaches.  Tiredness.  Loss of sex drive.  Insomnia.  Other symptoms. Hormone replacement carries certain risks, especially in older women. Women who use or are thinking about using estrogen or estrogen with progestin treatments should discuss that with their caregiver. Your caregiver will help you understand the benefits and risks. The ideal dose of hormone replacement therapy is not known. The Food and Drug Administration (FDA) has concluded that hormone therapy should be used only at the lowest doses and for the shortest amount of time to reach treatment goals.  OSTEOPOROSIS Protecting Against Bone Loss and Preventing Fracture If you use hormone therapy for prevention of bone loss (osteoporosis), the risks for bone loss must outweigh the risk of the therapy. Ask your caregiver about other medications known to be safe and effective for preventing bone loss and fractures. To guard against bone loss or fractures, the following is recommended:  If  you are younger than age 50, take 1000 mg of calcium and at least 600 mg of Vitamin D per day.  If you are older than age 50 but younger than age 70, take 1200 mg of calcium and at least 600 mg of Vitamin D per day.  If you are older than age 70, take 1200 mg of calcium and at least 800 mg of Vitamin D per day. Smoking and excessive alcohol intake increases the risk of osteoporosis. Eat foods rich in calcium and vitamin D and do weight bearing exercises several times a week as your caregiver suggests. DIABETES Diabetes Mellitus: If you have type I or type 2 diabetes, you should keep your blood sugar under control with diet, exercise, and recommended medication. Avoid starchy and fatty foods, and too many sweets. Being overweight can make diabetes control more difficult. COGNITION AND MEMORY Cognition and Memory: Menopausal hormone therapy is not recommended for the prevention of cognitive disorders such as Alzheimer's disease or memory loss.  DEPRESSION  Depression may occur at any age, but it is common in elderly women. This may be because of physical, medical, social (loneliness), or financial problems and needs. If you are experiencing depression because of medical problems and control of symptoms, talk to your caregiver about this. Physical   activity and exercise may help with mood and sleep. Community and volunteer involvement may improve your sense of value and worth. If you have depression and you feel that the problem is getting worse or becoming severe, talk to your caregiver about which treatment options are best for you. ACCIDENTS  Accidents are common and can be serious in elderly woman. Prepare your house to prevent accidents. Eliminate throw rugs, place hand bars in bath, shower, and toilet areas. Avoid wearing high heeled shoes or walking on wet, snowy, and icy areas. Limit or stop driving if you have vision or hearing problems, or if you feel you are unsteady with your movements and  reflexes. HEPATITIS C Hepatitis C is a type of viral infection affecting the liver. It is spread mainly through contact with blood from an infected person. It can be treated, but if left untreated, it can lead to severe liver damage over the years. Many people who are infected do not know that the virus is in their blood. If you are a "baby-boomer", it is recommended that you have one screening test for Hepatitis C. IMMUNIZATIONS  Several immunizations are important to consider having during your senior years, including:   Tetanus, diphtheria, and pertussis booster shot.  Influenza every year before the flu season begins.  Pneumonia vaccine.  Shingles vaccine.  Others, as indicated based on your specific needs. Talk to your caregiver about these. Document Released: 11/22/2005 Document Revised: 02/14/2014 Document Reviewed: 07/18/2008 [employer] Patient Information 2015 [employer]. This information is not intended to replace advice given to you by your health care provider. Make sure you discuss any questions you have with your health care provider.  

## 2014-07-22 LAB — VITAMIN D 25 HYDROXY (VIT D DEFICIENCY, FRACTURES): VIT D 25 HYDROXY: 56 ng/mL (ref 30–89)

## 2014-07-22 LAB — TSH: TSH: 0.518 u[IU]/mL (ref 0.350–4.500)

## 2014-08-15 ENCOUNTER — Encounter: Payer: Self-pay | Admitting: Women's Health

## 2016-01-31 ENCOUNTER — Encounter: Payer: Self-pay | Admitting: Women's Health

## 2016-01-31 ENCOUNTER — Ambulatory Visit (INDEPENDENT_AMBULATORY_CARE_PROVIDER_SITE_OTHER): Payer: BLUE CROSS/BLUE SHIELD | Admitting: Women's Health

## 2016-01-31 VITALS — BP 126/80 | Ht 65.0 in | Wt 125.0 lb

## 2016-01-31 DIAGNOSIS — Z01419 Encounter for gynecological examination (general) (routine) without abnormal findings: Secondary | ICD-10-CM | POA: Diagnosis not present

## 2016-01-31 DIAGNOSIS — Z13220 Encounter for screening for lipoid disorders: Secondary | ICD-10-CM | POA: Diagnosis not present

## 2016-01-31 DIAGNOSIS — Z1329 Encounter for screening for other suspected endocrine disorder: Secondary | ICD-10-CM | POA: Diagnosis not present

## 2016-01-31 NOTE — Progress Notes (Signed)
Illene SilverKelly C Decarolis 1961-07-03 [id number]    History:    Presents for annual exam.  Postmenopausal/no bleeding/no HRT. Partner of many years died in January from tongue and esophageal cancer. Helped care for him for one year, is attending hospice counseling. Normal Pap history, Overdue for mammogram. Has not had a colonoscopy.  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 Christina's 24 type 1 diabetes, history of anorexia continues to struggle . Son is finishing up a PhD.  ROS:  A ROS was performed and pertinent positives and negatives are included.  Exam:  Filed Vitals:   01/31/16 1123  BP: 126/80    General appearance:  Normal Thyroid:  Symmetrical, normal in size, without palpable masses or nodularity. Respiratory  Auscultation:  Clear without wheezing or rhonchi Cardiovascular  Auscultation:  Regular rate, without rubs, murmurs or gallops  Edema/varicosities:  Not grossly evident Abdominal  Soft,nontender, without masses, guarding or rebound.  Liver/spleen:  No organomegaly noted  Hernia:  None appreciated  Skin  Inspection:  Grossly normal   Breasts: Examined lying and sitting.     Right: Without masses, retractions, discharge or axillary adenopathy.     Left: Without masses, retractions, discharge or axillary adenopathy. Gentitourinary   Inguinal/mons:  Normal without inguinal adenopathy  External genitalia:  Normal  BUS/Urethra/Skene's glands:  Normal  Vagina:  Normal  Cervix:  Normal  Uterus:   normal in size, shape and contour.  Midline and mobile  Adnexa/parametria:     Rt: Without masses or tenderness.   Lt: Without masses or tenderness.  Anus and perineum: Normal  Digital rectal exam: Normal sphincter tone without palpated masses or tenderness  Assessment/Plan:  55 y.o. DWF G2P2 for annual exam.    Postmenopausal/no HRT/no bleeding Situational grief  Plan:  Strongly encouraged to continue hospice grief counseling, increase leisure activities, and continue exercise. SBE's, overdue for mammogram, instructed to schedule breast center information given and reviewed. Screening colonoscopy reviewed, instructed to schedule, [facility] information given and reviewed. Calcium rich diet, vitamin D 1000 daily encouraged. CBC, lipid panel, CMP, vitamin D, TSH, UA, Pap with HR HPV typing, new screening guidelines reviewed.    Harrington ChallengerYOUNG,Jalynn Betzold J [facility] CenterWHNP, 12:42 PM 01/31/2016

## 2016-01-31 NOTE — Patient Instructions (Signed)
[place] [phone]  Colonoscopy  Dr Carlean Purl  At [facility]  [phone]   Menopause is a normal process in which your reproductive ability comes to an end. This process happens gradually over a span of months to years, usually between the ages of 58 and 69. Menopause is complete when you have missed 12 consecutive menstrual periods. It is important to talk with your health care provider about some of the most common conditions that affect postmenopausal women, such as heart disease, cancer, and bone loss (osteoporosis). Adopting a healthy lifestyle and getting preventive care can help to promote your health and wellness. Those actions can also lower your chances of developing some of these common conditions. WHAT SHOULD I KNOW ABOUT MENOPAUSE? During menopause, you may experience a number of symptoms, such as:  Moderate-to-severe hot flashes.  Night sweats.  Decrease in sex drive.  Mood swings.  Headaches.  Tiredness.  Irritability.  Memory problems.  Insomnia. Choosing to treat or not to treat menopausal changes is an individual decision that you make with your health care provider. WHAT SHOULD I KNOW ABOUT HORMONE REPLACEMENT THERAPY AND SUPPLEMENTS? Hormone therapy products are effective for treating symptoms that are associated with menopause, such as hot flashes and night sweats. Hormone replacement carries certain risks, especially as you become older. If you are thinking about using estrogen or estrogen with progestin treatments, discuss the benefits and risks with your health care provider. WHAT SHOULD I KNOW ABOUT HEART DISEASE AND STROKE? Heart disease, heart attack, and stroke become more likely as you age. This may be due, in part, to the hormonal changes that your body experiences during menopause. These can affect how your body processes dietary fats, triglycerides, and cholesterol. Heart attack and stroke are both medical emergencies. There are many things  that you can do to help prevent heart disease and stroke:  Have your blood pressure checked at least every 1-2 years. High blood pressure causes heart disease and increases the risk of stroke.  If you are 37-43 years old, ask your health care provider if you should take aspirin to prevent a heart attack or a stroke.  Do not use any tobacco products, including cigarettes, chewing tobacco, or electronic cigarettes. If you need help quitting, ask your health care provider.  It is important to eat a healthy diet and maintain a healthy weight.  Be sure to include plenty of vegetables, fruits, low-fat dairy products, and lean protein.  Avoid eating foods that are high in solid fats, added sugars, or salt (sodium).  Get regular exercise. This is one of the most important things that you can do for your health.  Try to exercise for at least 150 minutes each week. The type of exercise that you do should increase your heart rate and make you sweat. This is known as moderate-intensity exercise.  Try to do strengthening exercises at least twice each week. Do these in addition to the moderate-intensity exercise.  Know your numbers.Ask your health care provider to check your cholesterol and your blood glucose. Continue to have your blood tested as directed by your health care provider. WHAT SHOULD I KNOW ABOUT CANCER SCREENING? There are several types of cancer. Take the following steps to reduce your risk and to catch any cancer development as early as possible. Breast Cancer  Practice breast self-awareness.  This means understanding how your breasts normally appear and feel.  It also means doing regular breast self-exams. Let your health care provider know about any  changes, no matter how small.  If you are 70 or older, have a clinician do a breast exam (clinical breast exam or CBE) every year. Depending on your age, family history, and medical history, it may be recommended that you also have a  yearly breast X-ray (mammogram).  If you have a family history of breast cancer, talk with your health care provider about genetic screening.  If you are at high risk for breast cancer, talk with your health care provider about having an MRI and a mammogram every year.  Breast cancer (BRCA) gene test is recommended for women who have family members with BRCA-related cancers. Results of the assessment will determine the need for genetic counseling and BRCA1 and for BRCA2 testing. BRCA-related cancers include these types:  Breast. This occurs in males or females.  Ovarian.  Tubal. This may also be called fallopian tube cancer.  Cancer of the abdominal or pelvic lining (peritoneal cancer).  Prostate.  Pancreatic. Cervical, Uterine, and Ovarian Cancer Your health care provider may recommend that you be screened regularly for cancer of the pelvic organs. These include your ovaries, uterus, and vagina. This screening involves a pelvic exam, which includes checking for microscopic changes to the surface of your cervix (Pap test).  For women ages 21-65, health care providers may recommend a pelvic exam and a Pap test every three years. For women ages 20-65, they may recommend the Pap test and pelvic exam, combined with testing for human papilloma virus (HPV), every five years. Some types of HPV increase your risk of cervical cancer. Testing for HPV may also be done on women of any age who have unclear Pap test results.  Other health care providers may not recommend any screening for nonpregnant women who are considered low risk for pelvic cancer and have no symptoms. Ask your health care provider if a screening pelvic exam is right for you.  If you have had past treatment for cervical cancer or a condition that could lead to cancer, you need Pap tests and screening for cancer for at least 20 years after your treatment. If Pap tests have been discontinued for you, your risk factors (such as having a  new sexual partner) need to be reassessed to determine if you should start having screenings again. Some women have medical problems that increase the chance of getting cervical cancer. In these cases, your health care provider may recommend that you have screening and Pap tests more often.  If you have a family history of uterine cancer or ovarian cancer, talk with your health care provider about genetic screening.  If you have vaginal bleeding after reaching menopause, tell your health care provider.  There are currently no reliable tests available to screen for ovarian cancer. Lung Cancer Lung cancer screening is recommended for adults 20-31 years old who are at high risk for lung cancer because of a history of smoking. A yearly low-dose CT scan of the lungs is recommended if you:  Currently smoke.  Have a history of at least 30 pack-years of smoking and you currently smoke or have quit within the past 15 years. A pack-year is smoking an average of one pack of cigarettes per day for one year. Yearly screening should:  Continue until it has been 15 years since you quit.  Stop if you develop a health problem that would prevent you from having lung cancer treatment. Colorectal Cancer  This type of cancer can be detected and can often be prevented.  Routine  colorectal cancer screening usually begins at age 50 and continues through age 75.  If you have risk factors for colon cancer, your health care provider may recommend that you be screened at an earlier age.  If you have a family history of colorectal cancer, talk with your health care provider about genetic screening.  Your health care provider may also recommend using home test kits to check for hidden blood in your stool.  A small camera at the end of a tube can be used to examine your colon directly (sigmoidoscopy or colonoscopy). This is done to check for the earliest forms of colorectal cancer.  Direct examination of the colon  should be repeated every 5-10 years until age 75. However, if early forms of precancerous polyps or small growths are found or if you have a family history or genetic risk for colorectal cancer, you may need to be screened more often. Skin Cancer  Check your skin from head to toe regularly.  Monitor any moles. Be sure to tell your health care provider:  About any new moles or changes in moles, especially if there is a change in a mole's shape or color.  If you have a mole that is larger than the size of a pencil eraser.  If any of your family members has a history of skin cancer, especially at a young age, talk with your health care provider about genetic screening.  Always use sunscreen. Apply sunscreen liberally and repeatedly throughout the day.  Whenever you are outside, protect yourself by wearing long sleeves, pants, a wide-brimmed hat, and sunglasses. WHAT SHOULD I KNOW ABOUT OSTEOPOROSIS? Osteoporosis is a condition in which bone destruction happens more quickly than new bone creation. After menopause, you may be at an increased risk for osteoporosis. To help prevent osteoporosis or the bone fractures that can happen because of osteoporosis, the following is recommended:  If you are 19-50 years old, get at least 1,000 mg of calcium and at least 600 mg of vitamin D per day.  If you are older than age 50 but younger than age 70, get at least 1,200 mg of calcium and at least 600 mg of vitamin D per day.  If you are older than age 70, get at least 1,200 mg of calcium and at least 800 mg of vitamin D per day. Smoking and excessive alcohol intake increase the risk of osteoporosis. Eat foods that are rich in calcium and vitamin D, and do weight-bearing exercises several times each week as directed by your health care provider. WHAT SHOULD I KNOW ABOUT HOW MENOPAUSE AFFECTS MY MENTAL HEALTH? Depression may occur at any age, but it is more common as you become older. Common symptoms of  depression include:  Low or sad mood.  Changes in sleep patterns.  Changes in appetite or eating patterns.  Feeling an overall lack of motivation or enjoyment of activities that you previously enjoyed.  Frequent crying spells. Talk with your health care provider if you think that you are experiencing depression. WHAT SHOULD I KNOW ABOUT IMMUNIZATIONS? It is important that you get and maintain your immunizations. These include:  Tetanus, diphtheria, and pertussis (Tdap) booster vaccine.  Influenza every year before the flu season begins.  Pneumonia vaccine.  Shingles vaccine. Your health care provider may also recommend other immunizations.   This information is not intended to replace advice given to you by your health care provider. Make sure you discuss any questions you have with your health care provider.     Document Released: 11/22/2005 Document Revised: 10/21/2014 Document Reviewed: 06/02/2014 Elsevier Interactive Patient Education [employer].

## 2016-02-01 LAB — URINALYSIS W MICROSCOPIC + REFLEX CULTURE
BACTERIA UA: NONE SEEN [HPF]
Bilirubin Urine: NEGATIVE
CASTS: NONE SEEN [LPF]
CRYSTALS: NONE SEEN [HPF]
Glucose, UA: NEGATIVE
Hgb urine dipstick: NEGATIVE
Ketones, ur: NEGATIVE
Leukocytes, UA: NEGATIVE
Nitrite: NEGATIVE
PROTEIN: NEGATIVE
RBC / HPF: NONE SEEN RBC/HPF (ref <=2)
Specific Gravity, Urine: 1.01 (ref 1.001–1.035)
Squamous Epithelial / LPF: NONE SEEN [HPF] (ref <=5)
WBC, UA: NONE SEEN WBC/HPF (ref <=5)
YEAST: NONE SEEN [HPF]
pH: 7 (ref 5.0–8.0)

## 2016-02-03 LAB — PAP, TP IMAGING W/ HPV RNA, RFLX HPV TYPE 16,18/45: HPV MRNA, HIGH RISK: NOT DETECTED

## 2016-02-07 ENCOUNTER — Other Ambulatory Visit: Payer: Self-pay

## 2016-02-07 ENCOUNTER — Other Ambulatory Visit: Payer: BLUE CROSS/BLUE SHIELD

## 2016-02-07 DIAGNOSIS — Z13220 Encounter for screening for lipoid disorders: Secondary | ICD-10-CM | POA: Diagnosis not present

## 2016-02-07 DIAGNOSIS — Z01419 Encounter for gynecological examination (general) (routine) without abnormal findings: Secondary | ICD-10-CM | POA: Diagnosis not present

## 2016-02-07 DIAGNOSIS — Z1329 Encounter for screening for other suspected endocrine disorder: Secondary | ICD-10-CM | POA: Diagnosis not present

## 2016-02-07 DIAGNOSIS — Z1231 Encounter for screening mammogram for malignant neoplasm of breast: Secondary | ICD-10-CM

## 2016-02-07 LAB — COMPREHENSIVE METABOLIC PANEL
ALK PHOS: 50 U/L (ref 33–130)
ALT: 32 U/L — AB (ref 6–29)
AST: 31 U/L (ref 10–35)
Albumin: 4 g/dL (ref 3.6–5.1)
BILIRUBIN TOTAL: 0.5 mg/dL (ref 0.2–1.2)
BUN: 16 mg/dL (ref 7–25)
CALCIUM: 9.5 mg/dL (ref 8.6–10.4)
CO2: 27 mmol/L (ref 20–31)
Chloride: 105 mmol/L (ref 98–110)
Creat: 0.84 mg/dL (ref 0.50–1.05)
Glucose, Bld: 80 mg/dL (ref 65–99)
Potassium: 4.4 mmol/L (ref 3.5–5.3)
Sodium: 140 mmol/L (ref 135–146)
Total Protein: 6.9 g/dL (ref 6.1–8.1)

## 2016-02-07 LAB — CBC WITH DIFFERENTIAL/PLATELET
Basophils Absolute: 51 cells/uL (ref 0–200)
Basophils Relative: 1 %
Eosinophils Absolute: 153 cells/uL (ref 15–500)
Eosinophils Relative: 3 %
HEMATOCRIT: 41.9 % (ref 35.0–45.0)
Hemoglobin: 13.7 g/dL (ref 11.7–15.5)
LYMPHS PCT: 25 %
Lymphs Abs: 1275 cells/uL (ref 850–3900)
MCH: 29.3 pg (ref 27.0–33.0)
MCHC: 32.7 g/dL (ref 32.0–36.0)
MCV: 89.7 fL (ref 80.0–100.0)
MPV: 9.6 fL (ref 7.5–12.5)
Monocytes Absolute: 306 cells/uL (ref 200–950)
Monocytes Relative: 6 %
Neutro Abs: 3315 cells/uL (ref 1500–7800)
Neutrophils Relative %: 65 %
PLATELETS: 311 10*3/uL (ref 140–400)
RBC: 4.67 MIL/uL (ref 3.80–5.10)
RDW: 13.8 % (ref 11.0–15.0)
WBC: 5.1 10*3/uL (ref 3.8–10.8)

## 2016-02-07 LAB — TSH: TSH: 0.66 mIU/L

## 2016-02-07 LAB — LIPID PANEL
CHOL/HDL RATIO: 2.7 ratio (ref <=5.0)
CHOLESTEROL: 189 mg/dL (ref 125–200)
HDL: 71 mg/dL (ref >=46)
LDL Cholesterol: 108 mg/dL (ref <130)
Triglycerides: 52 mg/dL (ref <150)
VLDL: 10 mg/dL (ref <30)

## 2016-02-08 LAB — VITAMIN D 25 HYDROXY (VIT D DEFICIENCY, FRACTURES): Vit D, 25-Hydroxy: 35 ng/mL (ref 30–100)

## 2016-03-06 ENCOUNTER — Ambulatory Visit
Admission: RE | Admit: 2016-03-06 | Discharge: 2016-03-06 | Disposition: A | Discharge status: Home / Self Care | Payer: BLUE CROSS/BLUE SHIELD | Source: Ambulatory Visit

## 2016-03-06 DIAGNOSIS — Z1231 Encounter for screening mammogram for malignant neoplasm of breast: Secondary | ICD-10-CM

## 2016-03-08 ENCOUNTER — Other Ambulatory Visit: Payer: Self-pay | Admitting: Women's Health

## 2016-03-08 DIAGNOSIS — R928 Other abnormal and inconclusive findings on diagnostic imaging of breast: Secondary | ICD-10-CM

## 2016-03-27 ENCOUNTER — Ambulatory Visit
Admission: RE | Admit: 2016-03-27 | Discharge: 2016-03-27 | Disposition: A | Discharge status: Home / Self Care | Payer: BLUE CROSS/BLUE SHIELD | Source: Ambulatory Visit | Attending: Women's Health | Admitting: Women's Health

## 2016-03-27 DIAGNOSIS — R928 Other abnormal and inconclusive findings on diagnostic imaging of breast: Secondary | ICD-10-CM

## 2016-03-27 DIAGNOSIS — R922 Inconclusive mammogram: Secondary | ICD-10-CM | POA: Diagnosis not present

## 2016-11-12 DIAGNOSIS — Z0000 Encounter for general adult medical examination without abnormal findings: Secondary | ICD-10-CM | POA: Diagnosis not present

## 2016-11-12 DIAGNOSIS — E7800 Pure hypercholesterolemia, unspecified: Secondary | ICD-10-CM | POA: Diagnosis not present

## 2016-11-12 DIAGNOSIS — E559 Vitamin D deficiency, unspecified: Secondary | ICD-10-CM | POA: Diagnosis not present

## 2016-11-18 DIAGNOSIS — Z0000 Encounter for general adult medical examination without abnormal findings: Secondary | ICD-10-CM | POA: Diagnosis not present

## 2017-01-21 ENCOUNTER — Ambulatory Visit: Payer: BLUE CROSS/BLUE SHIELD | Admitting: Neurology

## 2017-02-14 ENCOUNTER — Other Ambulatory Visit: Payer: Self-pay | Admitting: Women's Health

## 2017-02-14 DIAGNOSIS — Z1231 Encounter for screening mammogram for malignant neoplasm of breast: Secondary | ICD-10-CM

## 2017-02-26 ENCOUNTER — Encounter: Payer: Self-pay | Admitting: Gynecology

## 2017-03-19 ENCOUNTER — Encounter: Payer: Self-pay | Admitting: Women's Health

## 2017-03-19 ENCOUNTER — Ambulatory Visit (INDEPENDENT_AMBULATORY_CARE_PROVIDER_SITE_OTHER): Payer: BLUE CROSS/BLUE SHIELD | Admitting: Women's Health

## 2017-03-19 VITALS — BP 110/78 | Ht 65.0 in | Wt 128.0 lb

## 2017-03-19 DIAGNOSIS — Z01419 Encounter for gynecological examination (general) (routine) without abnormal findings: Secondary | ICD-10-CM

## 2017-03-19 NOTE — Progress Notes (Signed)
Loretta Parker 09/03/1961 [id number]    History:    Presents for annual exam. Postmenopausal on no HRT with no bleeding, not sexually active for years, long-term partnerMike died 10/2015 from cancer. Normal pap and mammogram history. Has not had a screening colonoscopy. Vitamin D 35. Had labs at primary care reports as normal.  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 Daughter type 1 diabetes with anxiety currently working at a Therapist, sportsvet office.  ROS:  A ROS was performed and pertinent positives and negatives are included.  Exam:  Vitals:   03/19/17 0935  BP: 110/78  Weight: 128 lb (58.1 kg)  Height: 5\' 5"  (1.651 m)   Body mass index is 21.3 kg/m.   General appearance:  Normal Thyroid:  Symmetrical, normal in size, without palpable masses or nodularity. Respiratory  Auscultation:  Clear without wheezing or rhonchi Cardiovascular  Auscultation:  Regular rate, without rubs, murmurs or gallops  Edema/varicosities:  Not grossly evident Abdominal  Soft,nontender, without masses, guarding or rebound.  Liver/spleen:  No organomegaly noted  Hernia:  None appreciated  Skin  Inspection:  Grossly normal   Breasts: Examined lying and sitting.     Right: Without masses, retractions, discharge or axillary adenopathy.     Left: Without masses, retractions, discharge or axillary adenopathy. Gentitourinary   Inguinal/mons:  Normal without inguinal adenopathy  External genitalia:  Normal  BUS/Urethra/Skene's glands:  Normal  Vagina:  Normal  Cervix:  Normal  Uterus:   normal in size, shape and contour.  Midline and mobile  Adnexa/parametria:     Rt: Without masses or tenderness.   Lt: Without masses or tenderness.  Anus and perineum: Normal  Digital rectal exam: Normal sphincter tone without palpated masses or tenderness  Assessment/Plan:  56 y.o. S WF G2 P2  for  annual exam with complaint of several pound weight gain and having difficulty losing.  Postmenopausal/no HRT/no bleeding  Plan: Encouraged to continue leisure activities, self-care, exercise. SBE's, annual screening mammogram has scheduled will keep appointment. Has not had a screening colonoscopy strongly encouraged, [facility] information given instructed to schedule. Continue healthy lifestyle of diet and exercise. Continue over-the-counter vitamin D 5000 daily. Reassurance given regarding normality of weight, BMI, will watch carbohydrates. Pap normal with negative HR HPV 2017, new screening guidelines reviewed.    Harrington Challengerancy J Parker [facility] Springs Rehabilitation CenterWHNP, 10:33 AM 03/19/2017

## 2017-03-19 NOTE — Patient Instructions (Signed)
Dr Carlean Purl  [phone]  Colonoscopy  Health Maintenance for Postmenopausal Women Menopause is a normal process in which your reproductive ability comes to an end. This process happens gradually over a span of months to years, usually between the ages of 64 and 54. Menopause is complete when you have missed 12 consecutive menstrual periods. It is important to talk with your health care provider about some of the most common conditions that affect postmenopausal women, such as heart disease, cancer, and bone loss (osteoporosis). Adopting a healthy lifestyle and getting preventive care can help to promote your health and wellness. Those actions can also lower your chances of developing some of these common conditions. What should I know about menopause? During menopause, you may experience a number of symptoms, such as:  Moderate-to-severe hot flashes.  Night sweats.  Decrease in sex drive.  Mood swings.  Headaches.  Tiredness.  Irritability.  Memory problems.  Insomnia.  Choosing to treat or not to treat menopausal changes is an individual decision that you make with your health care provider. What should I know about hormone replacement therapy and supplements? Hormone therapy products are effective for treating symptoms that are associated with menopause, such as hot flashes and night sweats. Hormone replacement carries certain risks, especially as you become older. If you are thinking about using estrogen or estrogen with progestin treatments, discuss the benefits and risks with your health care provider. What should I know about heart disease and stroke? Heart disease, heart attack, and stroke become more likely as you age. This may be due, in part, to the hormonal changes that your body experiences during menopause. These can affect how your body processes dietary fats, triglycerides, and cholesterol. Heart attack and stroke are both medical emergencies. There are many things that you  can do to help prevent heart disease and stroke:  Have your blood pressure checked at least every 1-2 years. High blood pressure causes heart disease and increases the risk of stroke.  If you are 48-22 years old, ask your health care provider if you should take aspirin to prevent a heart attack or a stroke.  Do not use any tobacco products, including cigarettes, chewing tobacco, or electronic cigarettes. If you need help quitting, ask your health care provider.  It is important to eat a healthy diet and maintain a healthy weight. ? Be sure to include plenty of vegetables, fruits, low-fat dairy products, and lean protein. ? Avoid eating foods that are high in solid fats, added sugars, or salt (sodium).  Get regular exercise. This is one of the most important things that you can do for your health. ? Try to exercise for at least 150 minutes each week. The type of exercise that you do should increase your heart rate and make you sweat. This is known as moderate-intensity exercise. ? Try to do strengthening exercises at least twice each week. Do these in addition to the moderate-intensity exercise.  Know your numbers.Ask your health care provider to check your cholesterol and your blood glucose. Continue to have your blood tested as directed by your health care provider.  What should I know about cancer screening? There are several types of cancer. Take the following steps to reduce your risk and to catch any cancer development as early as possible. Breast Cancer  Practice breast self-awareness. ? This means understanding how your breasts normally appear and feel. ? It also means doing regular breast self-exams. Let your health care provider know about any changes, no matter  how small.  If you are 64 or older, have a clinician do a breast exam (clinical breast exam or CBE) every year. Depending on your age, family history, and medical history, it may be recommended that you also have a yearly  breast X-ray (mammogram).  If you have a family history of breast cancer, talk with your health care provider about genetic screening.  If you are at high risk for breast cancer, talk with your health care provider about having an MRI and a mammogram every year.  Breast cancer (BRCA) gene test is recommended for women who have family members with BRCA-related cancers. Results of the assessment will determine the need for genetic counseling and BRCA1 and for BRCA2 testing. BRCA-related cancers include these types: ? Breast. This occurs in males or females. ? Ovarian. ? Tubal. This may also be called fallopian tube cancer. ? Cancer of the abdominal or pelvic lining (peritoneal cancer). ? Prostate. ? Pancreatic.  Cervical, Uterine, and Ovarian Cancer Your health care provider may recommend that you be screened regularly for cancer of the pelvic organs. These include your ovaries, uterus, and vagina. This screening involves a pelvic exam, which includes checking for microscopic changes to the surface of your cervix (Pap test).  For women ages 21-65, health care providers may recommend a pelvic exam and a Pap test every three years. For women ages 60-65, they may recommend the Pap test and pelvic exam, combined with testing for human papilloma virus (HPV), every five years. Some types of HPV increase your risk of cervical cancer. Testing for HPV may also be done on women of any age who have unclear Pap test results.  Other health care providers may not recommend any screening for nonpregnant women who are considered low risk for pelvic cancer and have no symptoms. Ask your health care provider if a screening pelvic exam is right for you.  If you have had past treatment for cervical cancer or a condition that could lead to cancer, you need Pap tests and screening for cancer for at least 20 years after your treatment. If Pap tests have been discontinued for you, your risk factors (such as having a new  sexual partner) need to be reassessed to determine if you should start having screenings again. Some women have medical problems that increase the chance of getting cervical cancer. In these cases, your health care provider may recommend that you have screening and Pap tests more often.  If you have a family history of uterine cancer or ovarian cancer, talk with your health care provider about genetic screening.  If you have vaginal bleeding after reaching menopause, tell your health care provider.  There are currently no reliable tests available to screen for ovarian cancer.  Lung Cancer Lung cancer screening is recommended for adults 48-78 years old who are at high risk for lung cancer because of a history of smoking. A yearly low-dose CT scan of the lungs is recommended if you:  Currently smoke.  Have a history of at least 30 pack-years of smoking and you currently smoke or have quit within the past 15 years. A pack-year is smoking an average of one pack of cigarettes per day for one year.  Yearly screening should:  Continue until it has been 15 years since you quit.  Stop if you develop a health problem that would prevent you from having lung cancer treatment.  Colorectal Cancer  This type of cancer can be detected and can often be prevented.  Routine colorectal cancer screening usually begins at age 15 and continues through age 53.  If you have risk factors for colon cancer, your health care provider may recommend that you be screened at an earlier age.  If you have a family history of colorectal cancer, talk with your health care provider about genetic screening.  Your health care provider may also recommend using home test kits to check for hidden blood in your stool.  A small camera at the end of a tube can be used to examine your colon directly (sigmoidoscopy or colonoscopy). This is done to check for the earliest forms of colorectal cancer.  Direct examination of the  colon should be repeated every 5-10 years until age 77. However, if early forms of precancerous polyps or small growths are found or if you have a family history or genetic risk for colorectal cancer, you may need to be screened more often.  Skin Cancer  Check your skin from head to toe regularly.  Monitor any moles. Be sure to tell your health care provider: ? About any new moles or changes in moles, especially if there is a change in a mole's shape or color. ? If you have a mole that is larger than the size of a pencil eraser.  If any of your family members has a history of skin cancer, especially at a Loretta Parker age, talk with your health care provider about genetic screening.  Always use sunscreen. Apply sunscreen liberally and repeatedly throughout the day.  Whenever you are outside, protect yourself by wearing long sleeves, pants, a wide-brimmed hat, and sunglasses.  What should I know about osteoporosis? Osteoporosis is a condition in which bone destruction happens more quickly than new bone creation. After menopause, you may be at an increased risk for osteoporosis. To help prevent osteoporosis or the bone fractures that can happen because of osteoporosis, the following is recommended:  If you are 97-61 years old, get at least 1,000 mg of calcium and at least 600 mg of vitamin D per day.  If you are older than age 89 but younger than age 71, get at least 1,200 mg of calcium and at least 600 mg of vitamin D per day.  If you are older than age 7, get at least 1,200 mg of calcium and at least 800 mg of vitamin D per day.  Smoking and excessive alcohol intake increase the risk of osteoporosis. Eat foods that are rich in calcium and vitamin D, and do weight-bearing exercises several times each week as directed by your health care provider. What should I know about how menopause affects my mental health? Depression may occur at any age, but it is more common as you become older. Common  symptoms of depression include:  Low or sad mood.  Changes in sleep patterns.  Changes in appetite or eating patterns.  Feeling an overall lack of motivation or enjoyment of activities that you previously enjoyed.  Frequent crying spells.  Talk with your health care provider if you think that you are experiencing depression. What should I know about immunizations? It is important that you get and maintain your immunizations. These include:  Tetanus, diphtheria, and pertussis (Tdap) booster vaccine.  Influenza every year before the flu season begins.  Pneumonia vaccine.  Shingles vaccine.  Your health care provider may also recommend other immunizations. This information is not intended to replace advice given to you by your health care provider. Make sure you discuss any questions you have with  your health care provider. Document Released: 11/22/2005 Document Revised: 04/19/2016 Document Reviewed: 07/04/2015 Elsevier Interactive Patient Education  2018 [employer].

## 2017-03-26 ENCOUNTER — Ambulatory Visit: Payer: BLUE CROSS/BLUE SHIELD

## 2017-04-01 ENCOUNTER — Ambulatory Visit: Payer: BLUE CROSS/BLUE SHIELD | Admitting: Neurology

## 2019-05-28 ENCOUNTER — Other Ambulatory Visit: Payer: Self-pay | Admitting: Foot & Ankle Surgery

## 2019-05-28 ENCOUNTER — Other Ambulatory Visit: Payer: Self-pay | Admitting: Women's Health

## 2019-05-28 DIAGNOSIS — Z1231 Encounter for screening mammogram for malignant neoplasm of breast: Secondary | ICD-10-CM

## 2019-06-02 ENCOUNTER — Ambulatory Visit
Admission: RE | Admit: 2019-06-02 | Discharge: 2019-06-02 | Disposition: A | Discharge status: Home / Self Care | Payer: BC Managed Care – PPO | Source: Ambulatory Visit | Attending: Women's Health | Admitting: Women's Health

## 2019-06-02 ENCOUNTER — Other Ambulatory Visit: Payer: Self-pay

## 2019-06-02 DIAGNOSIS — Z1231 Encounter for screening mammogram for malignant neoplasm of breast: Secondary | ICD-10-CM | POA: Diagnosis not present

## 2019-08-02 ENCOUNTER — Encounter: Payer: Self-pay | Admitting: Women's Health

## 2019-08-02 ENCOUNTER — Ambulatory Visit (INDEPENDENT_AMBULATORY_CARE_PROVIDER_SITE_OTHER): Payer: BC Managed Care – PPO | Admitting: Women's Health

## 2019-08-02 ENCOUNTER — Other Ambulatory Visit: Payer: Self-pay

## 2019-08-02 VITALS — BP 120/78 | Ht 65.0 in | Wt 132.0 lb

## 2019-08-02 DIAGNOSIS — Z01419 Encounter for gynecological examination (general) (routine) without abnormal findings: Secondary | ICD-10-CM

## 2019-08-02 DIAGNOSIS — E559 Vitamin D deficiency, unspecified: Secondary | ICD-10-CM | POA: Diagnosis not present

## 2019-08-02 DIAGNOSIS — Z113 Encounter for screening for infections with a predominantly sexual mode of transmission: Secondary | ICD-10-CM | POA: Diagnosis not present

## 2019-08-02 DIAGNOSIS — R5383 Other fatigue: Secondary | ICD-10-CM | POA: Diagnosis not present

## 2019-08-02 DIAGNOSIS — Z13220 Encounter for screening for lipoid disorders: Secondary | ICD-10-CM

## 2019-08-02 NOTE — Patient Instructions (Addendum)
It was good seeing you today  [facility]  [phone]  Dr Carlean Purl  Health Maintenance for Postmenopausal Women Menopause is a normal process in which your ability to get pregnant comes to an end. This process happens slowly over many months or years, usually between the ages of 39 and 83. Menopause is complete when you have missed your menstrual periods for 12 months. It is important to talk with your health care provider about some of the most common conditions that affect women after menopause (postmenopausal women). These include heart disease, cancer, and bone loss (osteoporosis). Adopting a healthy lifestyle and getting preventive care can help to promote your health and wellness. The actions you take can also lower your chances of developing some of these common conditions. What should I know about menopause? During menopause, you may get a number of symptoms, such as:  Hot flashes. These can be moderate or severe.  Night sweats.  Decrease in sex drive.  Mood swings.  Headaches.  Tiredness.  Irritability.  Memory problems.  Insomnia. Choosing to treat or not to treat these symptoms is a decision that you make with your health care provider. Do I need hormone replacement therapy?  Hormone replacement therapy is effective in treating symptoms that are caused by menopause, such as hot flashes and night sweats.  Hormone replacement carries certain risks, especially as you become older. If you are thinking about using estrogen or estrogen with progestin, discuss the benefits and risks with your health care provider. What is my risk for heart disease and stroke? The risk of heart disease, heart attack, and stroke increases as you age. One of the causes may be a change in the body's hormones during menopause. This can affect how your body uses dietary fats, triglycerides, and cholesterol. Heart attack and stroke are medical emergencies. There are many things that you can do to help  prevent heart disease and stroke. Watch your blood pressure  High blood pressure causes heart disease and increases the risk of stroke. This is more likely to develop in people who have high blood pressure readings, are of African descent, or are overweight.  Have your blood pressure checked: ? Every 3-5 years if you are 73-23 years of age. ? Every year if you are 43 years old or older. Eat a healthy diet   Eat a diet that includes plenty of vegetables, fruits, low-fat dairy products, and lean protein.  Do not eat a lot of foods that are high in solid fats, added sugars, or sodium. Get regular exercise Get regular exercise. This is one of the most important things you can do for your health. Most adults should:  Try to exercise for at least 150 minutes each week. The exercise should increase your heart rate and make you sweat (moderate-intensity exercise).  Try to do strengthening exercises at least twice each week. Do these in addition to the moderate-intensity exercise.  Spend less time sitting. Even light physical activity can be beneficial. Other tips  Work with your health care provider to achieve or maintain a healthy weight.  Do not use any products that contain nicotine or tobacco, such as cigarettes, e-cigarettes, and chewing tobacco. If you need help quitting, ask your health care provider.  Know your numbers. Ask your health care provider to check your cholesterol and your blood sugar (glucose). Continue to have your blood tested as directed by your health care provider. Do I need screening for cancer? Depending on your health history and family  history, you may need to have cancer screening at different stages of your life. This may include screening for:  Breast cancer.  Cervical cancer.  Lung cancer.  Colorectal cancer. What is my risk for osteoporosis? After menopause, you may be at increased risk for osteoporosis. Osteoporosis is a condition in which bone  destruction happens more quickly than new bone creation. To help prevent osteoporosis or the bone fractures that can happen because of osteoporosis, you may take the following actions:  If you are 76-31 years old, get at least 1,000 mg of calcium and at least 600 mg of vitamin D per day.  If you are older than age 60 but younger than age 70, get at least 1,200 mg of calcium and at least 600 mg of vitamin D per day.  If you are older than age 98, get at least 1,200 mg of calcium and at least 800 mg of vitamin D per day. Smoking and drinking excessive alcohol increase the risk of osteoporosis. Eat foods that are rich in calcium and vitamin D, and do weight-bearing exercises several times each week as directed by your health care provider. How does menopause affect my mental health? Depression may occur at any age, but it is more common as you become older. Common symptoms of depression include:  Low or sad mood.  Changes in sleep patterns.  Changes in appetite or eating patterns.  Feeling an overall lack of motivation or enjoyment of activities that you previously enjoyed.  Frequent crying spells. Talk with your health care provider if you think that you are experiencing depression. General instructions See your health care provider for regular wellness exams and vaccines. This may include:  Scheduling regular health, dental, and eye exams.  Getting and maintaining your vaccines. These include: ? Influenza vaccine. Get this vaccine each year before the flu season begins. ? Pneumonia vaccine. ? Shingles vaccine. ? Tetanus, diphtheria, and pertussis (Tdap) booster vaccine. Your health care provider may also recommend other immunizations. Tell your health care provider if you have ever been abused or do not feel safe at home. Summary  Menopause is a normal process in which your ability to get pregnant comes to an end.  This condition causes hot flashes, night sweats, decreased  interest in sex, mood swings, headaches, or lack of sleep.  Treatment for this condition may include hormone replacement therapy.  Take actions to keep yourself healthy, including exercising regularly, eating a healthy diet, watching your weight, and checking your blood pressure and blood sugar levels.  Get screened for cancer and depression. Make sure that you are up to date with all your vaccines. This information is not intended to replace advice given to you by your health care provider. Make sure you discuss any questions you have with your health care provider. Document Released: 11/22/2005 Document Revised: 09/23/2018 Document Reviewed: 09/23/2018 Elsevier Patient Education  2020 [employer].

## 2019-08-02 NOTE — Progress Notes (Signed)
Loretta Parker February 04, 1961 [id number]    History:    Presents for annual exam.  Postmenopausal on no HRT with no bleeding.  1 abnormal Pap greater than 20 years ago normal Paps since.  Normal mammogram history.  Has not had a screening colonoscopy.  Sexually active new partner.  Has had increased fatigue.  [facility].  Past medical history, past surgical history, family history and social history were all reviewed and documented in the EPIC chart.  Personal trainer.  Christina type I diabetic (since age 23) 66 months pregnant having some issues with her diabetes and partner, currently living in [place].  Son has PhD lives in [place].  ROS:  A ROS was performed and pertinent positives and negatives are included.  Exam:  Vitals:   08/02/19 0928  BP: 120/78  Weight: 132 lb (59.9 kg)  Height: 5\' 5"  (1.651 m)   Body mass index is 21.97 kg/m.   General appearance:  Normal Thyroid:  Symmetrical, normal in size, without palpable masses or nodularity. Respiratory  Auscultation:  Clear without wheezing or rhonchi Cardiovascular  Auscultation:  Regular rate, without rubs, murmurs or gallops  Edema/varicosities:  Not grossly evident Abdominal  Soft,nontender, without masses, guarding or rebound.  Liver/spleen:  No organomegaly noted  Hernia:  None appreciated  Skin  Inspection:  Grossly normal   Breasts: Examined lying and sitting.     Right: Without masses, retractions, discharge or axillary adenopathy.     Left: Without masses, retractions, discharge or axillary adenopathy. Gentitourinary   Inguinal/mons:  Normal without inguinal adenopathy  External genitalia:  Normal  BUS/Urethra/Skene's glands:  Normal  Vagina:  Normal  Cervix:  Normal  Uterus:   normal in size, shape and contour.  Midline and mobile  Adnexa/parametria:     Rt: Without masses or tenderness.   Lt: Without masses or tenderness.  Anus and perineum: Normal  Digital rectal  exam: Normal sphincter tone without palpated masses or tenderness  Assessment/Plan:  58 y.o. D WF G2 P2 for annual exam with complaint of increased fatigue, weight gain.  Postmenopausal/no HRT/no bleeding STD screen  Plan: SBEs, continue annual screening mammogram, calcium rich foods, vitamin D 2000 daily encouraged.  Sleep hygiene reviewed, continue healthy lifestyle with healthy diet and regular exercise.  Reviewed BMI is 21 which is excellent, reassurance given.  Screening colonoscopy discussed and encouraged, [facility] information given.  CBC, TSH, vitamin D, CMP, lipid panel, GC/chlamydia, HIV, RPR.  Pap normal 2017 with negative HR HPV new screening guidelines reviewed.    [place], 10:13 AM 08/02/2019

## 2019-08-03 LAB — CBC WITH DIFFERENTIAL/PLATELET
Absolute Monocytes: 352 cells/uL (ref 200–950)
Basophils Absolute: 51 cells/uL (ref 0–200)
Basophils Relative: 1 %
Eosinophils Absolute: 133 cells/uL (ref 15–500)
Eosinophils Relative: 2.6 %
HCT: 42.9 % (ref 35.0–45.0)
Hemoglobin: 14.2 g/dL (ref 11.7–15.5)
Lymphs Abs: 1550 cells/uL (ref 850–3900)
MCH: 29.9 pg (ref 27.0–33.0)
MCHC: 33.1 g/dL (ref 32.0–36.0)
MCV: 90.3 fL (ref 80.0–100.0)
MPV: 10.2 fL (ref 7.5–12.5)
Monocytes Relative: 6.9 %
Neutro Abs: 3014 cells/uL (ref 1500–7800)
Neutrophils Relative %: 59.1 %
Platelets: 308 10*3/uL (ref 140–400)
RBC: 4.75 10*6/uL (ref 3.80–5.10)
RDW: 12.2 % (ref 11.0–15.0)
Total Lymphocyte: 30.4 %
WBC: 5.1 10*3/uL (ref 3.8–10.8)

## 2019-08-03 LAB — C. TRACHOMATIS/N. GONORRHOEAE RNA
C. trachomatis RNA, TMA: NOT DETECTED
N. gonorrhoeae RNA, TMA: NOT DETECTED

## 2019-08-03 LAB — COMPREHENSIVE METABOLIC PANEL
AG Ratio: 1.7 (calc) (ref 1.0–2.5)
ALT: 13 U/L (ref 6–29)
AST: 22 U/L (ref 10–35)
Albumin: 4.4 g/dL (ref 3.6–5.1)
Alkaline phosphatase (APISO): 47 U/L (ref 37–153)
BUN: 14 mg/dL (ref 7–25)
CO2: 29 mmol/L (ref 20–32)
Calcium: 9.8 mg/dL (ref 8.6–10.4)
Chloride: 104 mmol/L (ref 98–110)
Creat: 0.87 mg/dL (ref 0.50–1.05)
Globulin: 2.6 g/dL (calc) (ref 1.9–3.7)
Glucose, Bld: 86 mg/dL (ref 65–99)
Potassium: 4.2 mmol/L (ref 3.5–5.3)
Sodium: 141 mmol/L (ref 135–146)
Total Bilirubin: 0.5 mg/dL (ref 0.2–1.2)
Total Protein: 7 g/dL (ref 6.1–8.1)

## 2019-08-03 LAB — LIPID PANEL
Cholesterol: 230 mg/dL — ABNORMAL HIGH (ref <200)
HDL: 57 mg/dL (ref >=50)
LDL Cholesterol (Calc): 150 mg/dL (calc) — ABNORMAL HIGH
Non-HDL Cholesterol (Calc): 173 mg/dL (calc) — ABNORMAL HIGH (ref <130)
Total CHOL/HDL Ratio: 4 (calc) (ref <5.0)
Triglycerides: 111 mg/dL (ref <150)

## 2019-08-03 LAB — HIV ANTIBODY (ROUTINE TESTING W REFLEX): HIV 1&2 Ab, 4th Generation: NONREACTIVE

## 2019-08-03 LAB — VITAMIN D 25 HYDROXY (VIT D DEFICIENCY, FRACTURES): Vit D, 25-Hydroxy: 37 ng/mL (ref 30–100)

## 2019-08-03 LAB — TSH: TSH: 0.89 mIU/L (ref 0.40–4.50)

## 2019-08-03 LAB — RPR: RPR Ser Ql: NONREACTIVE

## 2020-01-07 DIAGNOSIS — L7211 Pilar cyst: Secondary | ICD-10-CM | POA: Diagnosis not present

## 2021-04-09 IMAGING — MG DIGITAL SCREENING BILATERAL MAMMOGRAM WITH CAD
4 series · 4 of 4 positions shown · non-contrast
Comparison: Previous exam(s).

CLINICAL DATA: Screening.

EXAM:
DIGITAL SCREENING BILATERAL MAMMOGRAM WITH CAD

[L MLO]
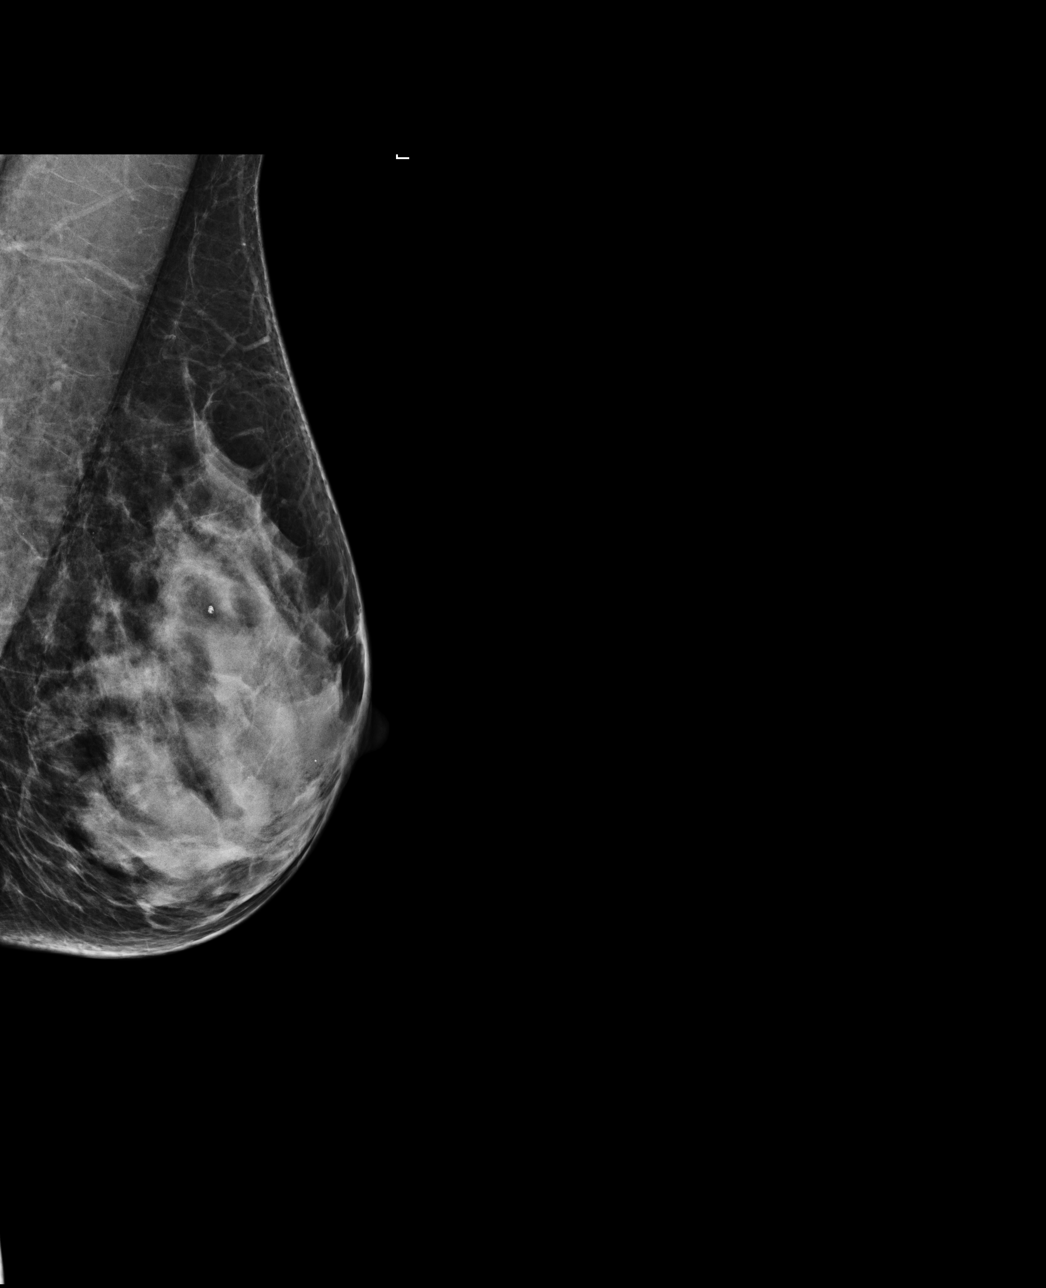

[R MLO]
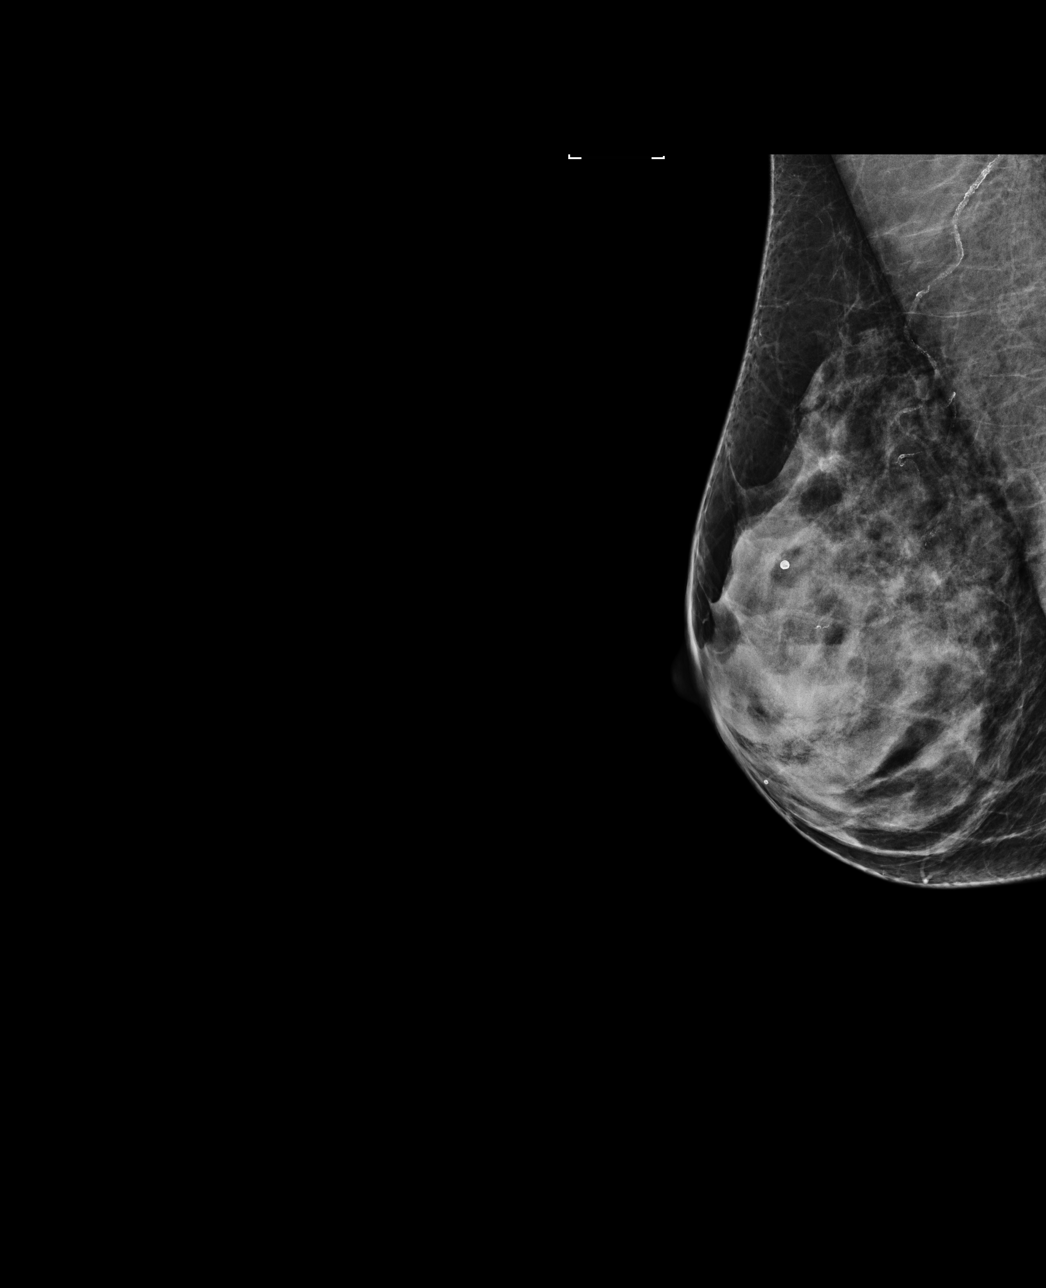

[L CC]
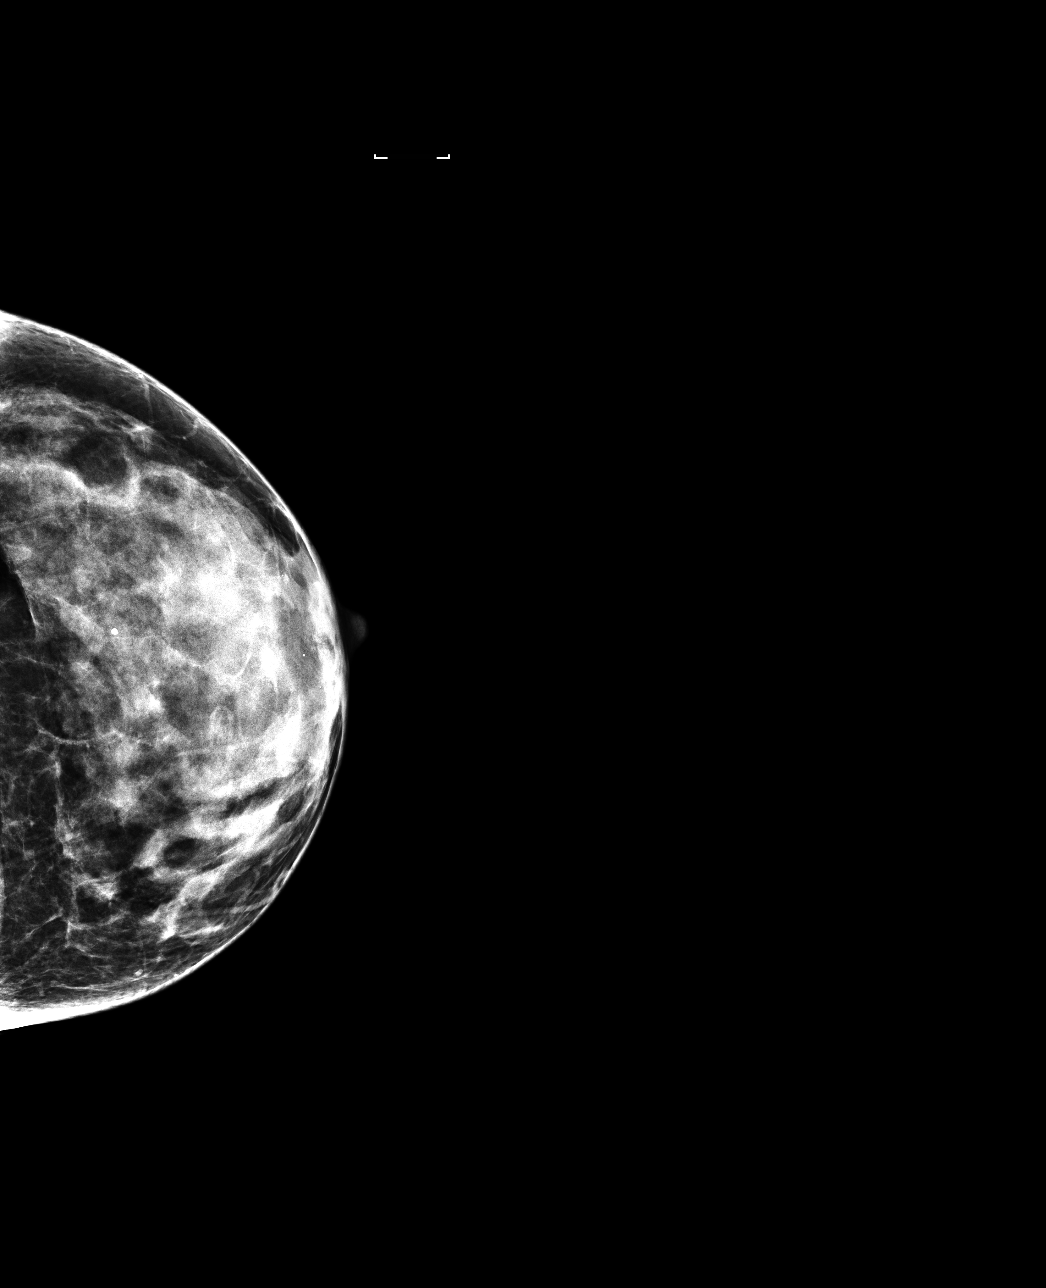

[R CC]
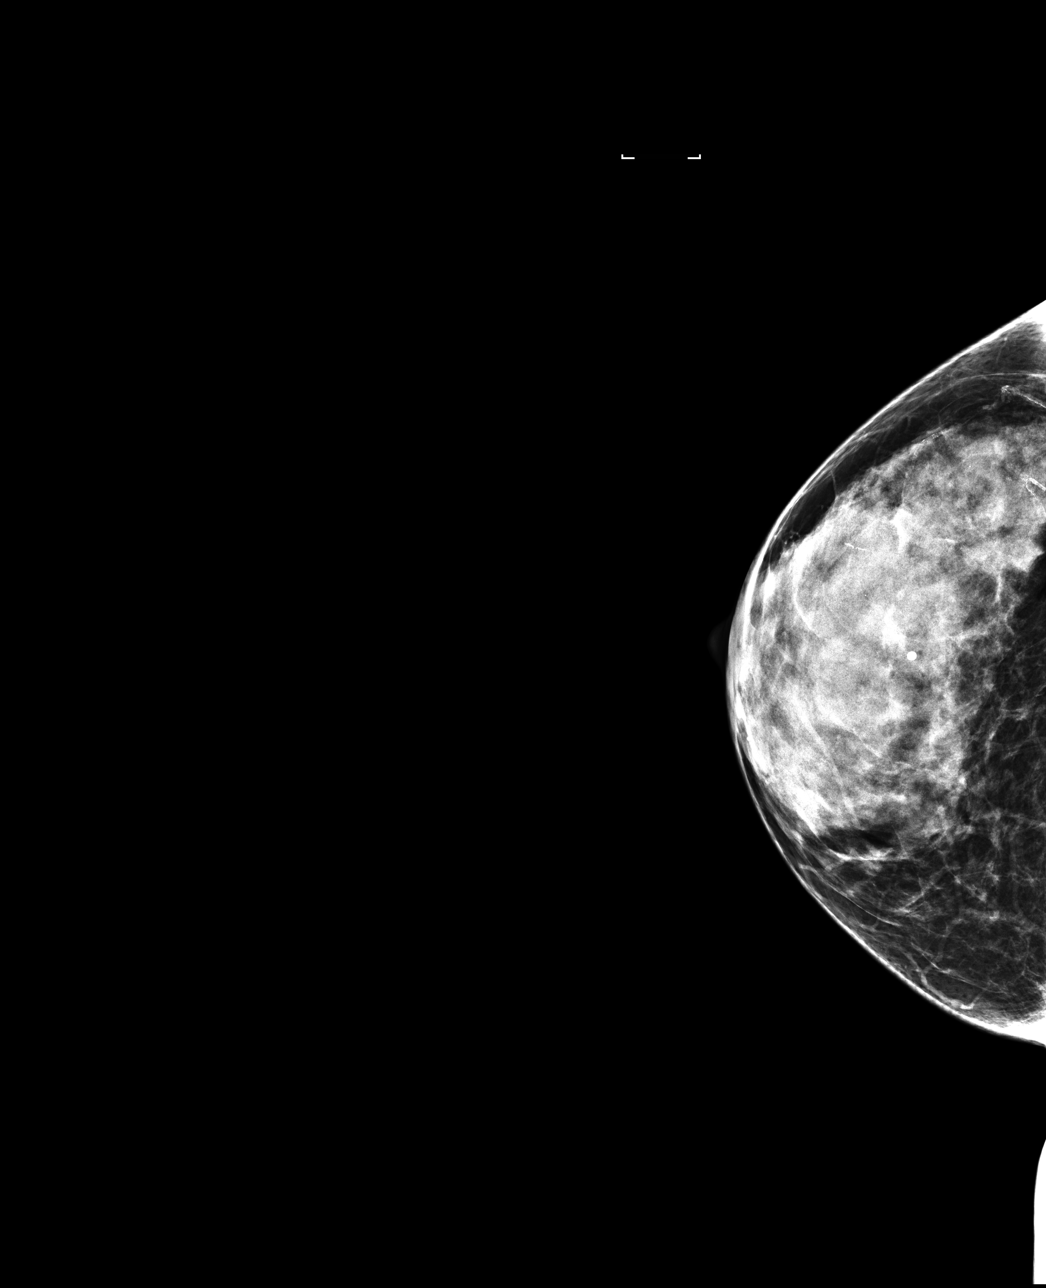

[4 of 4 positions shown; findings below may reference images not displayed]

ACR Breast Density Category c: The breast tissue is heterogeneously
dense, which may obscure small masses.
FINDINGS: There are no findings suspicious for malignancy. Images were
processed with CAD.
IMPRESSION: No mammographic evidence of malignancy. A result letter of this
screening mammogram will be mailed directly to the patient.

RECOMMENDATION:
Screening mammogram in one year. (Code:[id number])

BI-RADS CATEGORY  1: Negative.

## 2021-07-18 ENCOUNTER — Encounter: Payer: Self-pay | Admitting: Nurse Practitioner

## 2021-07-18 ENCOUNTER — Ambulatory Visit (INDEPENDENT_AMBULATORY_CARE_PROVIDER_SITE_OTHER): Payer: BC Managed Care – PPO | Admitting: Nurse Practitioner

## 2021-07-18 ENCOUNTER — Other Ambulatory Visit (HOSPITAL_COMMUNITY)
Admission: RE | Admit: 2021-07-18 | Discharge: 2021-07-18 | Disposition: A | Discharge status: Home / Self Care | Payer: BC Managed Care – PPO | Source: Ambulatory Visit | Attending: Nurse Practitioner | Admitting: Nurse Practitioner

## 2021-07-18 ENCOUNTER — Other Ambulatory Visit: Payer: Self-pay

## 2021-07-18 VITALS — BP 130/84 | Ht 63.5 in | Wt 117.0 lb

## 2021-07-18 DIAGNOSIS — E785 Hyperlipidemia, unspecified: Secondary | ICD-10-CM | POA: Diagnosis not present

## 2021-07-18 DIAGNOSIS — Z01419 Encounter for gynecological examination (general) (routine) without abnormal findings: Secondary | ICD-10-CM | POA: Diagnosis not present

## 2021-07-18 DIAGNOSIS — Z780 Asymptomatic menopausal state: Secondary | ICD-10-CM

## 2021-07-18 DIAGNOSIS — Z1211 Encounter for screening for malignant neoplasm of colon: Secondary | ICD-10-CM

## 2021-07-18 DIAGNOSIS — Z8349 Family history of other endocrine, nutritional and metabolic diseases: Secondary | ICD-10-CM | POA: Diagnosis not present

## 2021-07-18 LAB — URINALYSIS W MICROSCOPIC + REFLEX CULTURE
Glucose, UA: NEGATIVE
Hgb urine dipstick: NEGATIVE
Hyaline Cast: NONE SEEN /LPF
Leukocyte Esterase: NEGATIVE
Nitrites, Initial: NEGATIVE
Protein, ur: NEGATIVE
RBC / HPF: NONE SEEN /HPF (ref 0–2)
Specific Gravity, Urine: 1.023 (ref 1.001–1.035)
pH: 5.5 (ref 5.0–8.0)

## 2021-07-18 LAB — NO CULTURE INDICATED

## 2021-07-18 NOTE — Progress Notes (Signed)
   Loretta Parker [birth year] [id number]   History:  60 y.o. G2P2 presents for annual exam. Postmenopausal - no HRT, no bleeding. Cryosurgery >20 years ago, subsequent paps normal. Intermittent urinary frequency without dysuria or urgency.   Gynecologic History No LMP recorded. Patient is postmenopausal.   Contraception/Family planning: post menopausal status  Health Maintenance Last Pap: 01/31/2016. Results were: Normal Last mammogram: 06/02/2019. 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 Son working on PhD, living with patient right now.   ROS:  A ROS was performed and pertinent positives and negatives are included.  Exam:  Vitals:   07/18/21 1340  BP: 130/84  Weight: 117 lb (53.1 kg)  Height: 5' 3.5" (1.613 m)   Body mass index is 20.4 kg/m.  General appearance:  Normal Thyroid:  Symmetrical, normal in size, without palpable masses or nodularity. Respiratory  Auscultation:  Clear without wheezing or rhonchi Cardiovascular  Auscultation:  Regular rate, without rubs, murmurs or gallops  Edema/varicosities:  Not grossly evident Abdominal  Soft,nontender, without masses, guarding or rebound.  Liver/spleen:  No organomegaly noted  Hernia:  None appreciated  Skin  Inspection:  Grossly normal Breasts: Examined lying and sitting.   Right: Without masses, retractions, nipple discharge or axillary adenopathy.   Left: Without masses, retractions, nipple discharge or axillary adenopathy. Genitourinary   Inguinal/mons:  Normal without inguinal adenopathy  External genitalia:  Normal appearing vulva with no masses, tenderness, or lesions  BUS/Urethra/Skene's glands:  Normal  Vagina:  Atrophic changes  Cervix:  Normal appearing without discharge  or lesions  Uterus:  Normal in size, shape and contour.  Midline and mobile, nontender  Adnexa/parametria:     Rt: Normal in size, without masses or tenderness.   Lt: Normal in size, without masses or tenderness.  Anus and perineum: Normal  Digital rectal exam: Normal sphincter tone without palpated masses or tenderness  Patient informed chaperone available to be present for breast and pelvic exam. Patient has requested no chaperone to be present. Patient has been advised what will be completed during breast and pelvic exam.   Assessment/Plan:  60 y.o. G2P2 for annual exam.   Well female exam with routine gynecological exam - Plan: Cytology - PAP( [facility]), CBC with Differential/Platelet, Comprehensive metabolic panel, Urinalysis w microscopic + reflex culture. Education provided on SBEs, importance of preventative screenings, current guidelines, high calcium diet, regular exercise, and multivitamin daily. Establishing with PCP tomorrow but would like preventative lab work today.   Postmenopausal - no HRT, no bleeding.   Family history of thyroid disease - Plan: TSH  Hyperlipidemia, unspecified hyperlipidemia type - Plan: Lipid panel  Screening for colon cancer - Plan: Cologuard. Discussed current guidelines and importance of preventative screenings. She is not interested in colonoscopy but is agreeable to do Cologuard.   Screening for cervical cancer - Cryosurgery in the 80s, subsequent paps normal. Pap today.   Screening for breast cancer - Normal mammogram history.  Overdue. Discussed current guidelines and importance of preventative screenings. Normal breast exam today.  Return in 1 year for annual.   Olivia Mackie DNP, 2:19 PM 07/18/2021

## 2021-07-19 ENCOUNTER — Other Ambulatory Visit: Payer: Self-pay | Admitting: *Deleted

## 2021-07-19 ENCOUNTER — Other Ambulatory Visit: Payer: Self-pay | Admitting: Nurse Practitioner

## 2021-07-19 DIAGNOSIS — R7989 Other specified abnormal findings of blood chemistry: Secondary | ICD-10-CM

## 2021-07-19 DIAGNOSIS — Z780 Asymptomatic menopausal state: Secondary | ICD-10-CM | POA: Diagnosis not present

## 2021-07-19 DIAGNOSIS — Z0000 Encounter for general adult medical examination without abnormal findings: Secondary | ICD-10-CM | POA: Diagnosis not present

## 2021-07-19 LAB — COMPREHENSIVE METABOLIC PANEL
AG Ratio: 1.6 (calc) (ref 1.0–2.5)
ALT: 16 U/L (ref 6–29)
AST: 22 U/L (ref 10–35)
Albumin: 4.6 g/dL (ref 3.6–5.1)
Alkaline phosphatase (APISO): 52 U/L (ref 37–153)
BUN: 20 mg/dL (ref 7–25)
CO2: 30 mmol/L (ref 20–32)
Calcium: 10.5 mg/dL — ABNORMAL HIGH (ref 8.6–10.4)
Chloride: 104 mmol/L (ref 98–110)
Creat: 0.89 mg/dL (ref 0.50–1.05)
Globulin: 2.9 g/dL (calc) (ref 1.9–3.7)
Glucose, Bld: 83 mg/dL (ref 65–99)
Potassium: 4.5 mmol/L (ref 3.5–5.3)
Sodium: 141 mmol/L (ref 135–146)
Total Bilirubin: 0.5 mg/dL (ref 0.2–1.2)
Total Protein: 7.5 g/dL (ref 6.1–8.1)

## 2021-07-19 LAB — CBC WITH DIFFERENTIAL/PLATELET
Absolute Monocytes: 562 cells/uL (ref 200–950)
Basophils Absolute: 59 cells/uL (ref 0–200)
Basophils Relative: 0.8 %
Eosinophils Absolute: 104 cells/uL (ref 15–500)
Eosinophils Relative: 1.4 %
HCT: 47 % — ABNORMAL HIGH (ref 35.0–45.0)
Hemoglobin: 15.7 g/dL — ABNORMAL HIGH (ref 11.7–15.5)
Lymphs Abs: 1991 cells/uL (ref 850–3900)
MCH: 29.8 pg (ref 27.0–33.0)
MCHC: 33.4 g/dL (ref 32.0–36.0)
MCV: 89.2 fL (ref 80.0–100.0)
MPV: 9.9 fL (ref 7.5–12.5)
Monocytes Relative: 7.6 %
Neutro Abs: 4684 cells/uL (ref 1500–7800)
Neutrophils Relative %: 63.3 %
Platelets: 327 10*3/uL (ref 140–400)
RBC: 5.27 10*6/uL — ABNORMAL HIGH (ref 3.80–5.10)
RDW: 11.7 % (ref 11.0–15.0)
Total Lymphocyte: 26.9 %
WBC: 7.4 10*3/uL (ref 3.8–10.8)

## 2021-07-19 LAB — LIPID PANEL
Cholesterol: 231 mg/dL — ABNORMAL HIGH (ref <200)
HDL: 64 mg/dL (ref >=50)
LDL Cholesterol (Calc): 141 mg/dL (calc) — ABNORMAL HIGH
Non-HDL Cholesterol (Calc): 167 mg/dL (calc) — ABNORMAL HIGH (ref <130)
Total CHOL/HDL Ratio: 3.6 (calc) (ref <5.0)
Triglycerides: 131 mg/dL (ref <150)

## 2021-07-19 LAB — CYTOLOGY - PAP
Comment: NEGATIVE
Diagnosis: NEGATIVE
High risk HPV: NEGATIVE

## 2021-07-19 LAB — TSH: TSH: 0.4 mIU/L (ref 0.40–4.50)

## 2021-08-10 DIAGNOSIS — L7211 Pilar cyst: Secondary | ICD-10-CM | POA: Diagnosis not present

## 2022-11-20 DIAGNOSIS — J0140 Acute pansinusitis, unspecified: Secondary | ICD-10-CM | POA: Diagnosis not present

## 2022-11-20 DIAGNOSIS — R030 Elevated blood-pressure reading, without diagnosis of hypertension: Secondary | ICD-10-CM | POA: Diagnosis not present

## 2022-11-20 DIAGNOSIS — Z6820 Body mass index (BMI) 20.0-20.9, adult: Secondary | ICD-10-CM | POA: Diagnosis not present

## 2022-11-21 ENCOUNTER — Other Ambulatory Visit: Payer: Self-pay | Admitting: Registered Nurse

## 2022-11-21 DIAGNOSIS — Z1231 Encounter for screening mammogram for malignant neoplasm of breast: Secondary | ICD-10-CM

## 2022-11-28 ENCOUNTER — Ambulatory Visit: Payer: BC Managed Care – PPO

## 2023-01-07 ENCOUNTER — Encounter: Payer: Self-pay | Admitting: Nurse Practitioner

## 2023-01-08 ENCOUNTER — Ambulatory Visit (INDEPENDENT_AMBULATORY_CARE_PROVIDER_SITE_OTHER): Payer: BC Managed Care – PPO | Admitting: Nurse Practitioner

## 2023-01-08 ENCOUNTER — Encounter: Payer: Self-pay | Admitting: Nurse Practitioner

## 2023-01-08 VITALS — BP 124/88 | HR 70 | Ht 64.0 in | Wt 127.0 lb

## 2023-01-08 DIAGNOSIS — Z780 Asymptomatic menopausal state: Secondary | ICD-10-CM | POA: Diagnosis not present

## 2023-01-08 DIAGNOSIS — Z1211 Encounter for screening for malignant neoplasm of colon: Secondary | ICD-10-CM

## 2023-01-08 DIAGNOSIS — E785 Hyperlipidemia, unspecified: Secondary | ICD-10-CM | POA: Diagnosis not present

## 2023-01-08 DIAGNOSIS — Z01419 Encounter for gynecological examination (general) (routine) without abnormal findings: Secondary | ICD-10-CM

## 2023-01-08 DIAGNOSIS — Z8349 Family history of other endocrine, nutritional and metabolic diseases: Secondary | ICD-10-CM | POA: Diagnosis not present

## 2023-01-08 LAB — CBC WITH DIFFERENTIAL/PLATELET
Absolute Monocytes: 479 cells/uL (ref 200–950)
Basophils Absolute: 51 cells/uL (ref 0–200)
HCT: 44.4 % (ref 35.0–45.0)
Lymphs Abs: 1739 cells/uL (ref 850–3900)
MCHC: 32.9 g/dL (ref 32.0–36.0)
MPV: 10.2 fL (ref 7.5–12.5)
RBC: 4.95 10*6/uL (ref 3.80–5.10)
RDW: 12.2 % (ref 11.0–15.0)

## 2023-01-08 NOTE — Progress Notes (Signed)
   KHRISTINE VEREB October 02, 1961 [id number]   History:  62 y.o. G2P2 presents for annual exam. Postmenopausal - no HRT, no bleeding. Cryosurgery >20 years ago, subsequent paps normal.   Gynecologic History No LMP recorded. Patient is postmenopausal.   Contraception/Family planning: post menopausal status Sexually active: Yes  Health Maintenance Last Pap: 07/18/2021. Results were: Normal neg HPV, 5-year repeat Last mammogram: 06/02/2019. Results were: Normal. 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 Son finished PhD in Engineer, materials, moved to [place] to work on Engineer, technical sales.   ROS:  A ROS was performed and pertinent positives and negatives are included.  Exam:  Vitals:   01/08/23 1004  BP: 124/88  Pulse: 70  SpO2: 99%  Weight: 127 lb (57.6 kg)  Height: 5\' 4"  (1.626 m)    Body mass index is 21.8 kg/m.  General appearance:  Normal Thyroid:  Symmetrical, normal in size, without palpable masses or nodularity. Respiratory  Auscultation:  Clear without wheezing or rhonchi Cardiovascular  Auscultation:  Regular rate, without rubs, murmurs or gallops  Edema/varicosities:  Not grossly evident Abdominal  Soft,nontender, without masses, guarding or rebound.  Liver/spleen:  No organomegaly noted  Hernia:  None appreciated  Skin  Inspection:  Grossly normal Breasts: Examined lying and sitting.   Right: Without masses, retractions, nipple discharge or axillary adenopathy.   Left: Without masses, retractions, nipple discharge or axillary adenopathy. Genitourinary   Inguinal/mons:  Normal without inguinal adenopathy  External genitalia:  Normal appearing vulva with no masses, tenderness, or lesions  BUS/Urethra/Skene's glands:  Normal  Vagina:  Atrophic  changes  Cervix:  Normal appearing without discharge or lesions  Uterus:  Normal in size, shape and contour.  Midline and mobile, nontender  Adnexa/parametria:     Rt: Normal in size, without masses or tenderness.   Lt: Normal in size, without masses or tenderness.  Anus and perineum: Normal  Digital rectal exam: Deferred  Patient informed chaperone available to be present for breast and pelvic exam. Patient has requested no chaperone to be present. Patient has been advised what will be completed during breast and pelvic exam.   Assessment/Plan:  62 y.o. G2P2 for annual exam.   Well female exam with routine gynecological exam - Plan: CBC with Differential/Platelet, Comprehensive metabolic panel. Education provided on SBEs, importance of preventative screenings, current guidelines, high calcium diet, regular exercise, and multivitamin daily. Has PCP. Prefers labs here.   Postmenopausal - no HRT, no bleeding  Hyperlipidemia, unspecified hyperlipidemia type - Plan: Lipid panel  Screening for colon cancer - Plan: Cologuard. Discussed current guidelines and importance of preventative screenings. She is not interested in colonoscopy but is agreeable to do Cologuard.   Screening for cervical cancer - Cryosurgery in the 80s, subsequent paps normal. Will repeat at 5-year interval per guidelines.   Screening for breast cancer - Normal mammogram history.  Overdue, scheduled 01/15/23. Normal breast exam today.  Screening for osteoporosis - Average risk. Will plan DXA at age 60.   Return in 1 year for annual.     Tamela Gammon DNP, 10:19 AM 01/08/2023

## 2023-01-09 LAB — TSH: TSH: 0.77 mIU/L (ref 0.40–4.50)

## 2023-01-09 LAB — LIPID PANEL
Cholesterol: 222 mg/dL — ABNORMAL HIGH (ref <200)
HDL: 68 mg/dL (ref >=50)
LDL Cholesterol (Calc): 137 mg/dL (calc) — ABNORMAL HIGH
Non-HDL Cholesterol (Calc): 154 mg/dL (calc) — ABNORMAL HIGH (ref <130)
Total CHOL/HDL Ratio: 3.3 (calc) (ref <5.0)
Triglycerides: 73 mg/dL (ref <150)

## 2023-01-09 LAB — COMPREHENSIVE METABOLIC PANEL
AG Ratio: 1.7 (calc) (ref 1.0–2.5)
ALT: 34 U/L — ABNORMAL HIGH (ref 6–29)
AST: 24 U/L (ref 10–35)
Albumin: 4.4 g/dL (ref 3.6–5.1)
Alkaline phosphatase (APISO): 60 U/L (ref 37–153)
BUN: 22 mg/dL (ref 7–25)
CO2: 28 mmol/L (ref 20–32)
Calcium: 9.6 mg/dL (ref 8.6–10.4)
Chloride: 104 mmol/L (ref 98–110)
Creat: 1.04 mg/dL (ref 0.50–1.05)
Globulin: 2.6 g/dL (calc) (ref 1.9–3.7)
Glucose, Bld: 86 mg/dL (ref 65–99)
Potassium: 4.9 mmol/L (ref 3.5–5.3)
Sodium: 139 mmol/L (ref 135–146)
Total Bilirubin: 0.6 mg/dL (ref 0.2–1.2)
Total Protein: 7 g/dL (ref 6.1–8.1)

## 2023-01-09 LAB — CBC WITH DIFFERENTIAL/PLATELET
Basophils Relative: 0.9 %
Eosinophils Absolute: 200 cells/uL (ref 15–500)
Eosinophils Relative: 3.5 %
Hemoglobin: 14.6 g/dL (ref 11.7–15.5)
MCH: 29.5 pg (ref 27.0–33.0)
MCV: 89.7 fL (ref 80.0–100.0)
Monocytes Relative: 8.4 %
Neutro Abs: 3232 cells/uL (ref 1500–7800)
Neutrophils Relative %: 56.7 %
Platelets: 319 10*3/uL (ref 140–400)
Total Lymphocyte: 30.5 %
WBC: 5.7 10*3/uL (ref 3.8–10.8)

## 2023-01-15 ENCOUNTER — Ambulatory Visit
Admission: RE | Admit: 2023-01-15 | Discharge: 2023-01-15 | Disposition: A | Discharge status: Home / Self Care | Payer: BC Managed Care – PPO | Source: Ambulatory Visit | Attending: Registered Nurse | Admitting: Registered Nurse

## 2023-01-15 DIAGNOSIS — Z1231 Encounter for screening mammogram for malignant neoplasm of breast: Secondary | ICD-10-CM

## 2023-02-18 DIAGNOSIS — Z1211 Encounter for screening for malignant neoplasm of colon: Secondary | ICD-10-CM | POA: Diagnosis not present

## 2023-02-27 LAB — COLOGUARD: COLOGUARD: NEGATIVE

## 2023-06-22 DIAGNOSIS — R112 Nausea with vomiting, unspecified: Secondary | ICD-10-CM | POA: Diagnosis not present

## 2023-06-22 DIAGNOSIS — R197 Diarrhea, unspecified: Secondary | ICD-10-CM | POA: Diagnosis not present
